# Patient Record
Sex: Male | Born: 1985
Health system: Southern US, Community
[De-identification: ages and names within clinical notes are randomized; demographics above are authoritative.]

## PROBLEM LIST (undated history)

## (undated) DIAGNOSIS — B20 Human immunodeficiency virus [HIV] disease: Secondary | ICD-10-CM

## (undated) DIAGNOSIS — Z21 Asymptomatic human immunodeficiency virus [HIV] infection status: Secondary | ICD-10-CM

## (undated) DIAGNOSIS — D849 Immunodeficiency, unspecified: Secondary | ICD-10-CM

## (undated) DIAGNOSIS — A539 Syphilis, unspecified: Secondary | ICD-10-CM

## (undated) DIAGNOSIS — R51 Headache: Secondary | ICD-10-CM

## (undated) DIAGNOSIS — R519 Headache, unspecified: Secondary | ICD-10-CM

## (undated) HISTORY — DX: Syphilis, unspecified: A53.9

## (undated) HISTORY — DX: Human immunodeficiency virus (HIV) disease: B20

## (undated) HISTORY — DX: Asymptomatic human immunodeficiency virus (hiv) infection status: Z21

---
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
## 2004-01-10 ENCOUNTER — Emergency Department (HOSPITAL_COMMUNITY): Admission: EM | Admit: 2004-01-10 | Discharge: 2004-01-10 | Payer: Self-pay | Admitting: Emergency Medicine

## 2004-12-24 ENCOUNTER — Emergency Department (HOSPITAL_COMMUNITY): Admission: EM | Admit: 2004-12-24 | Discharge: 2004-12-24 | Payer: Self-pay | Admitting: Emergency Medicine

## 2005-07-31 ENCOUNTER — Emergency Department (HOSPITAL_COMMUNITY): Admission: EM | Admit: 2005-07-31 | Discharge: 2005-07-31 | Payer: Self-pay | Admitting: Emergency Medicine

## 2005-10-08 ENCOUNTER — Emergency Department (HOSPITAL_COMMUNITY): Admission: EM | Admit: 2005-10-08 | Discharge: 2005-10-08 | Payer: Self-pay | Admitting: Emergency Medicine

## 2007-08-13 ENCOUNTER — Emergency Department (HOSPITAL_COMMUNITY): Admission: EM | Admit: 2007-08-13 | Discharge: 2007-08-13 | Payer: Self-pay | Admitting: Emergency Medicine

## 2009-08-21 ENCOUNTER — Emergency Department (HOSPITAL_COMMUNITY): Admission: EM | Admit: 2009-08-21 | Discharge: 2009-08-21 | Payer: Self-pay | Admitting: Family Medicine

## 2009-08-24 ENCOUNTER — Emergency Department (HOSPITAL_COMMUNITY): Admission: EM | Admit: 2009-08-24 | Discharge: 2009-08-25 | Payer: Self-pay | Admitting: Emergency Medicine

## 2009-08-29 ENCOUNTER — Emergency Department (HOSPITAL_COMMUNITY): Admission: EM | Admit: 2009-08-29 | Discharge: 2009-08-29 | Payer: Self-pay | Admitting: Family Medicine

## 2010-02-18 ENCOUNTER — Emergency Department (HOSPITAL_COMMUNITY): Admission: EM | Admit: 2010-02-18 | Discharge: 2010-02-18 | Payer: Self-pay | Admitting: Emergency Medicine

## 2011-03-03 LAB — RAPID STREP SCREEN (MED CTR MEBANE ONLY): Streptococcus, Group A Screen (Direct): NEGATIVE

## 2011-03-15 LAB — POCT URINALYSIS DIP (DEVICE)
Bilirubin Urine: NEGATIVE
Glucose, UA: NEGATIVE mg/dL
Hgb urine dipstick: NEGATIVE
Ketones, ur: NEGATIVE mg/dL
Nitrite: NEGATIVE
Protein, ur: 30 mg/dL — AB
Specific Gravity, Urine: 1.025 (ref 1.005–1.030)
Urobilinogen, UA: 0.2 mg/dL (ref 0.0–1.0)
pH: 6 (ref 5.0–8.0)

## 2013-02-14 ENCOUNTER — Encounter (HOSPITAL_COMMUNITY): Payer: Self-pay | Admitting: Emergency Medicine

## 2013-02-14 DIAGNOSIS — K602 Anal fissure, unspecified: Secondary | ICD-10-CM | POA: Insufficient documentation

## 2013-02-14 LAB — CBC
HCT: 43.7 % (ref 39.0–52.0)
MCH: 30.3 pg (ref 26.0–34.0)
MCV: 87.2 fL (ref 78.0–100.0)
Platelets: 313 10*3/uL (ref 150–400)
RBC: 5.01 MIL/uL (ref 4.22–5.81)

## 2013-02-14 NOTE — ED Notes (Signed)
C/o blood in stool x 1 week.  Denies abd pain or any other symptoms.

## 2013-02-15 ENCOUNTER — Emergency Department (HOSPITAL_COMMUNITY)
Admission: EM | Admit: 2013-02-15 | Discharge: 2013-02-15 | Disposition: A | Discharge status: Home / Self Care | Payer: Self-pay | Attending: Emergency Medicine | Admitting: Emergency Medicine

## 2013-02-15 DIAGNOSIS — K602 Anal fissure, unspecified: Secondary | ICD-10-CM

## 2013-02-15 LAB — TYPE AND SCREEN
ABO/RH(D): O POS
Antibody Screen: NEGATIVE

## 2013-02-15 LAB — COMPREHENSIVE METABOLIC PANEL
AST: 21 U/L (ref 0–37)
BUN: 17 mg/dL (ref 6–23)
CO2: 27 mEq/L (ref 19–32)
Calcium: 9.5 mg/dL (ref 8.4–10.5)
Creatinine, Ser: 1.42 mg/dL — ABNORMAL HIGH (ref 0.50–1.35)
GFR calc Af Amer: 77 mL/min — ABNORMAL LOW (ref >90)
GFR calc non Af Amer: 67 mL/min — ABNORMAL LOW (ref >90)
Glucose, Bld: 88 mg/dL (ref 70–99)
Total Bilirubin: 0.6 mg/dL (ref 0.3–1.2)

## 2013-02-15 MED ORDER — DOCUSATE SODIUM 100 MG PO CAPS: 100.0000 mg | ORAL_CAPSULE | Freq: Two times a day (BID) | ORAL | Status: DC

## 2013-02-15 NOTE — ED Provider Notes (Addendum)
History     CSN: [id number]  Arrival date & time 02/14/13  2256   First MD Initiated Contact with Patient 02/15/13 0221      Chief Complaint  Patient presents with  . Rectal Bleeding    (Consider location/radiation/quality/duration/timing/severity/associated sxs/prior treatment) HPI With rectal bleeding onset 1 week ago. He states he sees flecks of blood mixed with brown stool and has pain at the anus upon having a bowel movement. Pain is worse with bowel movements bleeding is slight no bowel pain no change in appetite no fever no other complaint no other associated symptoms no treatment prior to coming here History reviewed. No pertinent past medical history. Medical history negative History reviewed. No pertinent past surgical history.  No family history on file.  History  Substance Use Topics  . Smoking status: Never Smoker   . Smokeless tobacco: Not on file  . Alcohol Use: No      Review of Systems  Constitutional: Negative.   HENT: Negative.   Respiratory: Negative.   Cardiovascular: Negative.   Gastrointestinal: Positive for blood in stool and rectal pain.  Musculoskeletal: Negative.   Skin: Negative.   Neurological: Negative.   Psychiatric/Behavioral: Negative.   All other systems reviewed and are negative.    Allergies  Review of patient's allergies indicates no known allergies.  Home Medications  No current outpatient prescriptions on file.  BP 173/93  Pulse 72  Temp(Src) 98.1 F (36.7 C) (Oral)  Resp 18  SpO2 99%  Physical Exam  Nursing note and vitals reviewed. Constitutional: He appears well-developed and well-nourished.  HENT:  Head: Normocephalic and atraumatic.  Eyes: Conjunctivae are normal. Pupils are equal, round, and reactive to light.  Neck: Neck supple. No tracheal deviation present. No thyromegaly present.  Cardiovascular: Normal rate and regular rhythm.   No murmur heard. Pulmonary/Chest: Effort normal and breath sounds normal.   Abdominal: Soft. Bowel sounds are normal. He exhibits no distension. There is no tenderness.  Genitourinary:  Anal fissure at 12:00. Tender on rectal exam. No stool on examining finger  Musculoskeletal: Normal range of motion. He exhibits no edema and no tenderness.  Neurological: He is alert. Coordination normal.  Skin: Skin is warm and dry. No rash noted.  Psychiatric: He has a normal mood and affect.    ED Course  Procedures (including critical care time)  Labs Reviewed  COMPREHENSIVE METABOLIC PANEL - Abnormal; Notable for the following:    Creatinine, Ser 1.42 (*)    GFR calc non Af Amer 67 (*)    GFR calc Af Amer 77 (*)    All other components within normal limits  CBC  TYPE AND SCREEN  ABO/RH   No results found.   No diagnosis found.  Results for orders placed during the hospital encounter of 02/15/13  CBC      Result Value Range   WBC 8.3  4.0 - 10.5 K/uL   RBC 5.01  4.22 - 5.81 MIL/uL   Hemoglobin 15.2  13.0 - 17.0 g/dL   HCT 56.2  13.0 - 86.5 %   MCV 87.2  78.0 - 100.0 fL   MCH 30.3  26.0 - 34.0 pg   MCHC 34.8  30.0 - 36.0 g/dL   RDW 78.4  69.6 - 29.5 %   Platelets 313  150 - 400 K/uL  COMPREHENSIVE METABOLIC PANEL      Result Value Range   Sodium 140  135 - 145 mEq/L   Potassium 4.4  3.5 - 5.1  mEq/L   Chloride 103  96 - 112 mEq/L   CO2 27  19 - 32 mEq/L   Glucose, Bld 88  70 - 99 mg/dL   BUN 17  6 - 23 mg/dL   Creatinine, Ser 8.11 (*) 0.50 - 1.35 mg/dL   Calcium 9.5  8.4 - 91.4 mg/dL   Total Protein 7.4  6.0 - 8.3 g/dL   Albumin 4.0  3.5 - 5.2 g/dL   AST 21  0 - 37 U/L   ALT RESULTS UNAVAILABLE DUE TO INTERFERING SUBSTANCE  0 - 53 U/L   Alkaline Phosphatase 50  39 - 117 U/L   Total Bilirubin 0.6  0.3 - 1.2 mg/dL   GFR calc non Af Amer 67 (*) >90 mL/min   GFR calc Af Amer 77 (*) >90 mL/min  OCCULT BLOOD, POC DEVICE      Result Value Range   Fecal Occult Bld NEGATIVE  NEGATIVE  TYPE AND SCREEN      Result Value Range   ABO/RH(D) O POS      Antibody Screen NEG     Sample Expiration 02/17/2013     No results found.   MDM  Plan sitz baths prescription Colace. Blood pressure recheck 3 weeks, renal function rechecked by PMD at [facility] gastroenterology referral as needed 1-2 weeks Diagnoses #1 anal fissure #2 elevated blood pressure  #3 renal insufficiency      Doug Sou, MD 02/15/13 7829  Doug Sou, MD 02/15/13 [phone]

## 2013-02-23 ENCOUNTER — Emergency Department (HOSPITAL_COMMUNITY)
Admission: EM | Admit: 2013-02-23 | Discharge: 2013-02-23 | Disposition: A | Discharge status: Home / Self Care | Payer: Self-pay | Attending: Emergency Medicine | Admitting: Emergency Medicine

## 2013-02-23 ENCOUNTER — Encounter (HOSPITAL_COMMUNITY): Payer: Self-pay | Admitting: *Deleted

## 2013-02-23 DIAGNOSIS — A64 Unspecified sexually transmitted disease: Secondary | ICD-10-CM | POA: Insufficient documentation

## 2013-02-23 DIAGNOSIS — K6289 Other specified diseases of anus and rectum: Secondary | ICD-10-CM | POA: Insufficient documentation

## 2013-02-23 DIAGNOSIS — A630 Anogenital (venereal) warts: Secondary | ICD-10-CM | POA: Insufficient documentation

## 2013-02-23 NOTE — ED Notes (Signed)
PT was seen and tx here on 3/9 for anal fissures.  Was told to increase fiber and follow up with [facility].  His s/s improved so much with a high fiber diet that he never went to see the specialist.  Last night pt noticed blood on tp when he wiped.  States no anal sex x 1 month and no constipation.

## 2013-02-23 NOTE — ED Provider Notes (Signed)
History     CSN: [id number]  Arrival date & time 02/23/13  4098   First MD Initiated Contact with Patient 02/23/13 2015      Chief Complaint  Patient presents with  . Rectal Bleeding   HPI  History provided by the patient. Patient is a 27 year old male with no known significant PMH who presents with concerns for episodes of rectal bleeding. Patient also states that he feels like he has noticed very small bumps and growths near his rectum while trying to examine himself in the mirror. Patient was seen 8 days ago for similar symptoms of rectal bleeding also associated with pain. At that time he was diagnosed with a superior anal fissure. Patient was using a high-fiber diet as well as. He was prescribed and stated that symptoms seem to improve. Last night bleeding began he can. Patient states that he has denies any constipation or hard bowel movements. There is only a very small amount of discomfort to the area. Blood is mostly on the tissue paper with no additional active bleeding. Patient is sexually active and is a homosexual who engages in anal sex. He states he is not been sexually active for the past one month. He does not know of any similar growths or lesions on his partner. Patient denies any recent fever, chills or sweats. No night sweats or unintentional weight loss. No swollen lymph nodes. No lesions of the penis her genitals. No penile discharge.     History reviewed. No pertinent past medical history.  History reviewed. No pertinent past surgical history.  No family history on file.  History  Substance Use Topics  . Smoking status: Never Smoker   . Smokeless tobacco: Not on file  . Alcohol Use: No      Review of Systems  Constitutional: Negative for fever, chills, diaphoresis, fatigue and unexpected weight change.  Respiratory: Negative for cough and shortness of breath.   Cardiovascular: Negative for chest pain.  Gastrointestinal: Positive for anal bleeding and  rectal pain.  Skin: Negative for rash.  Neurological: Negative for weakness.  All other systems reviewed and are negative.    Allergies  Review of patient's allergies indicates no known allergies.  Home Medications  No current outpatient prescriptions on file.  BP 156/96  Pulse 71  Temp(Src) 99.3 F (37.4 C) (Oral)  Resp 20  SpO2 99%  Physical Exam  Nursing note and vitals reviewed. Constitutional: He is oriented to person, place, and time. He appears well-developed and well-nourished. No distress.  HENT:  Head: Normocephalic.  Mouth/Throat: Oropharynx is clear and moist.  Neck: Neck supple.  Cardiovascular: Normal rate and regular rhythm.   Pulmonary/Chest: Effort normal and breath sounds normal. No respiratory distress. He has no wheezes.  Abdominal: Soft. There is no tenderness.  Genitourinary:  4 small pedunculated growths around the anus the largest being at the 5:00 position. There is a small tear with dry blood to the base of this lesion. No active bleeding. No significant pain or tenderness on digital exam. Stool appears normal. No internal masses.  Lymphadenopathy:    He has no cervical adenopathy.  Neurological: He is alert and oriented to person, place, and time.  Skin: Skin is warm.  Psychiatric: He has a normal mood and affect. His behavior is normal.    ED Course  Procedures      1. Condylomata acuminata   2. Sexually transmitted disease (STD)       MDM  8:20 PM patient seen and  evaluated. Patient resting comfortably appears in no acute distress. He does not appear ill or toxic.        Angus Seller, PA-C 02/23/13 2049

## 2013-02-23 NOTE — ED Notes (Signed)
Pt alert and mentating appropriately upon d/c; pt given d/c teaching and follow up care instructions; pt verbalizes understanding of d/c teaching and has no further questions upon d/c. Pt ambulatory upon d/c leaving with d/c teaching and follow up care instructions

## 2013-02-23 NOTE — ED Notes (Signed)
Peter Dammen, PA at bedside. 

## 2013-02-23 NOTE — ED Notes (Signed)
Pt states he has seen blood on toilet paper after having BM's since last night; pt denies fever/chills; pt denies n/v/d; pt denies dizziness and lightheadedness; pt denies numbness and tingling; pt alert and mentating appropriately; NAD noted at this time.

## 2013-02-25 NOTE — ED Provider Notes (Signed)
Medical screening examination/treatment/procedure(s) were performed by non-physician practitioner and as supervising physician I was immediately available for consultation/collaboration.  Palak Tercero, MD 02/25/13 1418 

## 2013-05-30 ENCOUNTER — Encounter (HOSPITAL_COMMUNITY): Payer: Self-pay | Admitting: Emergency Medicine

## 2013-05-30 ENCOUNTER — Emergency Department (HOSPITAL_COMMUNITY)
Admission: EM | Admit: 2013-05-30 | Discharge: 2013-05-30 | Disposition: A | Discharge status: Home / Self Care | Payer: Self-pay | Attending: Emergency Medicine | Admitting: Emergency Medicine

## 2013-05-30 DIAGNOSIS — R42 Dizziness and giddiness: Secondary | ICD-10-CM | POA: Insufficient documentation

## 2013-05-30 DIAGNOSIS — G44209 Tension-type headache, unspecified, not intractable: Secondary | ICD-10-CM | POA: Insufficient documentation

## 2013-05-30 HISTORY — DX: Headache: R51

## 2013-05-30 HISTORY — DX: Headache, unspecified: R51.9

## 2013-05-30 MED ORDER — KETOROLAC TROMETHAMINE 60 MG/2ML IM SOLN
60.0000 mg | Freq: Once | INTRAMUSCULAR | Status: AC
  Administered 2013-05-30: 60 mg via INTRAMUSCULAR
  Filled 2013-05-30: qty 2

## 2013-05-30 NOTE — ED Provider Notes (Signed)
History     CSN: [id number]  Arrival date & time 05/30/13  1734   First MD Initiated Contact with Patient 05/30/13 2035      Chief Complaint  Patient presents with  . Headache    (Consider location/radiation/quality/duration/timing/severity/associated sxs/prior treatment) HPI Comments: 27 year old male with a past medical history of headaches presents emergency department complaining of a headache beginning when he woke up from sleep earlier today. Patient states the headache is located above his left eye in the front, described as throbbing, intermittent, rated 7/10. Nothing in specific makes the headaches come or go. Initially he had dizziness associated with headache, however he went to church and the symptoms subsided. States he's been getting headaches on and off since the beginning of June. Normally able to eat something in the headache will go away. He tried eating something today without relief. Also took Advil without relief. Admits to decreased fluid intake today as he has not had a chance to drink as much water as normal. Denies visual disturbance, eye pain, nausea, vomiting or spots in the field of vision. No fever or chills. No history of migraines.  Patient is a 27 y.o. male presenting with headaches. The history is provided by the patient.  Headache Associated symptoms: dizziness (subsided)   Associated symptoms: no fever, no myalgias, no nausea, no neck pain, no neck stiffness, no numbness, no photophobia and no vomiting     Past Medical History  Diagnosis Date  . Headache     History reviewed. No pertinent past surgical history.  No family history on file.  History  Substance Use Topics  . Smoking status: Never Smoker   . Smokeless tobacco: Not on file  . Alcohol Use: No      Review of Systems  Constitutional: Negative for fever and chills.  HENT: Negative for neck pain and neck stiffness.   Eyes: Negative for photophobia and visual disturbance.    Gastrointestinal: Negative for nausea and vomiting.  Musculoskeletal: Negative for myalgias and arthralgias.  Skin: Negative for rash.  Neurological: Positive for dizziness (subsided) and headaches. Negative for weakness and numbness.  All other systems reviewed and are negative.    Allergies  Review of patient's allergies indicates no known allergies.  Home Medications  No current outpatient prescriptions on file.  BP 133/79  Pulse 55  Temp(Src) 98.6 F (37 C) (Oral)  Resp 16  SpO2 100%  Physical Exam  Nursing note and vitals reviewed. Constitutional: He is oriented to person, place, and time. He appears well-developed and well-nourished. No distress.  HENT:  Head: Normocephalic and atraumatic.    Mouth/Throat: Oropharynx is clear and moist.  Eyes: Conjunctivae and EOM are normal. Pupils are equal, round, and reactive to light.  Neck: Normal range of motion. Neck supple.  Cardiovascular: Normal rate, regular rhythm and normal heart sounds.   Pulmonary/Chest: Effort normal and breath sounds normal.  Musculoskeletal: Normal range of motion. He exhibits no edema.  Neurological: He is alert and oriented to person, place, and time. He has normal strength. No cranial nerve deficit or sensory deficit. Coordination and gait normal.  Skin: Skin is warm and dry. He is not diaphoretic.  Psychiatric: He has a normal mood and affect. His behavior is normal.    ED Course  Procedures (including critical care time)  Labs Reviewed - No data to display No results found.   1. Headache   2. Tension headache       MDM  Left frontal headache- no  red flags concerning patient's headache. No focal neurologic deficits, neuro exam unremarkable. No neck pain or stiffness. No visual disturbances, nausea or vomiting. He is in no apparent distress. Upon entering the room, he was watching a video on his cell phone. Will give Toradol and reassess. 9:41 PM headeache decreased to 3/10 with  toradol. He is in NAD. Stable for discharge. Advised increased fluid intake. Return precautions discussed. Patient states understanding of plan and is agreeable.       Trevor Mace, PA-C 05/30/13 2141

## 2013-05-30 NOTE — ED Notes (Signed)
Pt reports headache upon awaking today. Headache is located across forehead. Pt reports dizziness with headache. Pt denies change in vision.

## 2013-05-30 NOTE — ED Provider Notes (Signed)
Medical screening examination/treatment/procedure(s) were performed by non-physician practitioner and as supervising physician I was immediately available for consultation/collaboration.  Shelda Jakes, MD 05/30/13 2207

## 2013-12-02 ENCOUNTER — Emergency Department (HOSPITAL_COMMUNITY)
Admission: EM | Admit: 2013-12-02 | Discharge: 2013-12-02 | Disposition: A | Discharge status: Home / Self Care | Payer: Self-pay | Attending: Emergency Medicine | Admitting: Emergency Medicine

## 2013-12-02 ENCOUNTER — Encounter (HOSPITAL_COMMUNITY): Payer: Self-pay | Admitting: Emergency Medicine

## 2013-12-02 DIAGNOSIS — K602 Anal fissure, unspecified: Secondary | ICD-10-CM | POA: Insufficient documentation

## 2013-12-02 MED ORDER — DOCUSATE SODIUM 100 MG PO CAPS: 100.0000 mg | ORAL_CAPSULE | Freq: Two times a day (BID) | ORAL | Status: DC

## 2013-12-02 NOTE — ED Notes (Signed)
Pt states he had a bowel movement today, was straining and has been constipated, pt states he had specks of blood on his bowel movement. Pt states he has been increasing his fiber because he was seen by pcp in Aug for same and told that he may not be getting enough fiber in his diet. Pt denies any pain except with a bowel movement.

## 2013-12-02 NOTE — ED Notes (Signed)
Discharge and follow up instructions reviewed with pt. Pt verbalized understanding.  

## 2013-12-02 NOTE — ED Provider Notes (Signed)
CSN: [id number]     Arrival date & time 12/02/13  1913 History   First MD Initiated Contact with Patient 12/02/13 2014     Chief Complaint  Patient presents with  . Rectal Bleeding   (Consider location/radiation/quality/duration/timing/severity/associated sxs/prior Treatment) HPI Comments: Patient is a 27 year old male with history of anal fissure who presents today for rectal bleeding. His rectal bleeding has been mild for the past 3 weeks. He only has pain and BRBPR when has a bowel movement. The pain is sharp in nature. He has been adding Benefiber to his food, which initially seemed to help. He does still strain when he has a bowel movement and struggles with constipation. He denies any other symptoms including shortness of breath, fatigue, pallor, nausea, vomiting, abdominal pain.   Patient is a 27 y.o. male presenting with hematochezia. The history is provided by the patient. No language interpreter was used.  Rectal Bleeding Associated symptoms: no abdominal pain, no fever and no vomiting     Past Medical History  Diagnosis Date  . Headache    History reviewed. No pertinent past surgical history. History reviewed. No pertinent family history. History  Substance Use Topics  . Smoking status: Never Smoker   . Smokeless tobacco: Not on file  . Alcohol Use: No    Review of Systems  Constitutional: Negative for fever, chills and fatigue.  Respiratory: Negative for shortness of breath.   Cardiovascular: Negative for chest pain.  Gastrointestinal: Positive for constipation, hematochezia, anal bleeding and rectal pain. Negative for nausea, vomiting, abdominal pain, diarrhea and blood in stool.  All other systems reviewed and are negative.    Allergies  Review of patient's allergies indicates no known allergies.  Home Medications  No current outpatient prescriptions on file. BP 148/76  Pulse 68  Temp(Src) 97.9 F (36.6 C) (Oral)  Resp 18  Wt 218 lb 7 oz (99.083 kg)   SpO2 100% Physical Exam  Nursing note and vitals reviewed. Constitutional: He is oriented to person, place, and time. He appears well-developed and well-nourished. No distress.  HENT:  Head: Normocephalic and atraumatic.  Right Ear: External ear normal.  Left Ear: External ear normal.  Nose: Nose normal.  Eyes: Conjunctivae are normal.  Neck: Normal range of motion. No tracheal deviation present.  Cardiovascular: Normal rate, regular rhythm and normal heart sounds.   Pulmonary/Chest: Effort normal and breath sounds normal. No stridor.  Abdominal: Soft. He exhibits no distension. There is no tenderness.  Genitourinary: Rectal exam shows tenderness. Rectal exam shows anal tone normal.  Anterior anal fissure which appears chronic. TTP. No stool in rectal vault.   Musculoskeletal: Normal range of motion.  Neurological: He is alert and oriented to person, place, and time.  Skin: Skin is warm and dry. He is not diaphoretic.  Psychiatric: He has a normal mood and affect. His behavior is normal.    ED Course  Procedures (including critical care time) Labs Review Labs Reviewed - No data to display Imaging Review No results found.  EKG Interpretation   None       MDM   1. Anal fissure    Patient presents to Ed with anal fissure. No melena. No concern for GI bleed or severe blood loss. No sx of anemia. I do not feel as though any testing is required at this time. Pt already taking Benefiber. Rx for colace. Discussed use of Sitz bath for sx control. He will follow up with his PCP. Return instructions given. Vital  signs stable for discharge. Patient / Family / Caregiver informed of clinical course, understand medical decision-making process, and agree with plan.   Mora Bellman, PA-C 12/02/13 2110

## 2013-12-02 NOTE — ED Notes (Addendum)
Rectal bleeding x 3 weeks; mild. Went to pcp and told that pt. Wasn't eating enough fruit and fiber. Not sure if he has hemorrhoids.

## 2013-12-02 NOTE — ED Notes (Signed)
Pt states he has not been drinking as much water as he should.

## 2013-12-06 NOTE — ED Provider Notes (Signed)
Medical screening examination/treatment/procedure(s) were performed by non-physician practitioner and as supervising physician I was immediately available for consultation/collaboration.    Armella Stogner R Whittany Parish, MD 12/06/13 1607 

## 2014-01-27 ENCOUNTER — Encounter (HOSPITAL_COMMUNITY): Payer: Self-pay | Admitting: Emergency Medicine

## 2014-01-27 ENCOUNTER — Emergency Department (HOSPITAL_COMMUNITY)
Admission: EM | Admit: 2014-01-27 | Discharge: 2014-01-27 | Disposition: A | Discharge status: Home / Self Care | Payer: BC Managed Care – PPO | Attending: Emergency Medicine | Admitting: Emergency Medicine

## 2014-01-27 DIAGNOSIS — R05 Cough: Secondary | ICD-10-CM | POA: Insufficient documentation

## 2014-01-27 DIAGNOSIS — R059 Cough, unspecified: Secondary | ICD-10-CM | POA: Insufficient documentation

## 2014-01-27 DIAGNOSIS — J029 Acute pharyngitis, unspecified: Secondary | ICD-10-CM | POA: Insufficient documentation

## 2014-01-27 LAB — RAPID STREP SCREEN (MED CTR MEBANE ONLY): STREPTOCOCCUS, GROUP A SCREEN (DIRECT): NEGATIVE

## 2014-01-27 MED ORDER — HYDROCODONE-ACETAMINOPHEN 7.5-325 MG/15ML PO SOLN: 10.0000 mL | Freq: Four times a day (QID) | ORAL | Status: DC | PRN

## 2014-01-27 NOTE — ED Notes (Signed)
Pt dc to home. Pt sts understanding to dc instructions. Pt ambulatory to exit without difficulty.

## 2014-01-27 NOTE — Discharge Instructions (Signed)
Sore Throat A sore throat is pain, burning, irritation, or scratchiness of the throat. There is often pain or tenderness when swallowing or talking. A sore throat may be accompanied by other symptoms, such as coughing, sneezing, fever, and swollen neck glands. A sore throat is often the first sign of another sickness, such as a cold, flu, strep throat, or mononucleosis (commonly known as mono). Most sore throats go away without medical treatment. CAUSES  The most common causes of a sore throat include:  A viral infection, such as a cold, flu, or mono.  A bacterial infection, such as strep throat, tonsillitis, or whooping cough.  Seasonal allergies.  Dryness in the air.  Irritants, such as smoke or pollution.  Gastroesophageal reflux disease (GERD). HOME CARE INSTRUCTIONS   Only take over-the-counter medicines as directed by your caregiver.  Drink enough fluids to keep your urine clear or pale yellow.  Rest as needed.  Try using throat sprays, lozenges, or sucking on hard candy to ease any pain (if older than 4 years or as directed).  Sip warm liquids, such as broth, herbal tea, or warm water with honey to relieve pain temporarily. You may also eat or drink cold or frozen liquids such as frozen ice pops.  Gargle with salt water (mix 1 tsp salt with 8 oz of water).  Do not smoke and avoid secondhand smoke.  Put a cool-mist humidifier in your bedroom at night to moisten the air. You can also turn on a hot shower and sit in the bathroom with the door closed for 5 10 minutes. SEEK IMMEDIATE MEDICAL CARE IF:  You have difficulty breathing.  You are unable to swallow fluids, soft foods, or your saliva.  You have increased swelling in the throat.  Your sore throat does not get better in 7 days.  You have nausea and vomiting.  You have a fever or persistent symptoms for more than 2 3 days.  You have a fever and your symptoms suddenly get worse. MAKE SURE YOU:   Understand  these instructions.  Will watch your condition.  Will get help right away if you are not doing well or get worse. Document Released: 01/02/2005 Document Revised: 11/11/2012 Document Reviewed: 08/02/2012 [employer] Patient Information 2014 [employer].  

## 2014-01-27 NOTE — ED Notes (Signed)
Reports sore throat x 3 weeks, denies fever/chills. Airway intact.

## 2014-01-27 NOTE — ED Provider Notes (Signed)
CSN: [id number]     Arrival date & time 01/27/14  1423 History  This chart was scribed for Domenic Moras, PA working with Tanna Furry, MD by Roxan Diesel, ED Scribe. This patient was seen in room TR04C/TR04C and the patient's care was started at 3:37 PM.   Chief Complaint  Patient presents with  . Sore Throat    The history is provided by the patient. No language interpreter was used.    HPI Comments: Luis Vincent is a 28 y.o. male who presents to the Emergency Department complaining of a sore throat that has been occurring off-and-on for nearly one month.  Pt states that he initially thought he was developing a cold when he developed a sore threat but his sore throat then resolved spontaneously within several days.  His sore throat has continued to recur and then resolve on its own.  Pain is worsened by drinking and swallowing.  He has attempted to treat pain with Nyquil and cough drops, with some relief.  Today while brushing his teeth he also noticed "some white stuff" in the left side of his throat.  Pt also notes an associated cough which does not worsen his sore throat.  He denies rhinorrhea, fever, chills, or abdominal pain.   Past Medical History  Diagnosis Date  . Headache     History reviewed. No pertinent past surgical history.  History reviewed. No pertinent family history.   History  Substance Use Topics  . Smoking status: Never Smoker   . Smokeless tobacco: Not on file  . Alcohol Use: No     Review of Systems  Constitutional: Negative for fever and chills.  HENT: Positive for sore throat. Negative for rhinorrhea.   Respiratory: Positive for cough.   Gastrointestinal: Negative for abdominal pain.      Allergies  Review of patient's allergies indicates no known allergies.  Home Medications   No current outpatient prescriptions on file. BP 150/81  Pulse 74  Temp(Src) 98.9 F (37.2 C) (Oral)  Resp 20  Ht 5\' 11"  (1.803 m)  Wt 217 lb (98.431 kg)   BMI 30.28 kg/m2  SpO2 100%  Physical Exam  Nursing note and vitals reviewed. Constitutional: He is oriented to person, place, and time. He appears well-developed and well-nourished. No distress.  HENT:  Head: Normocephalic and atraumatic.  Left Ear: Tympanic membrane normal.  Nose: No rhinorrhea.  Mouth/Throat: Uvula is midline. Posterior oropharyngeal erythema present. No oropharyngeal exudate or posterior oropharyngeal edema.  No tonsillar enlargement or exudate.  No evidence of deep tissue infection. Right cerumen impaction, unable to visualize TM.  Eyes: EOM are normal.  Neck: Neck supple. No tracheal deviation present.  Cardiovascular: Normal rate.   Pulmonary/Chest: Effort normal. No respiratory distress.  Musculoskeletal: Normal range of motion.  Lymphadenopathy:    He has no cervical adenopathy.  Neurological: He is alert and oriented to person, place, and time.  Skin: Skin is warm and dry.  Psychiatric: He has a normal mood and affect. His behavior is normal.    ED Course  Procedures (including critical care time)  DIAGNOSTIC STUDIES: Oxygen Saturation is 100% on room air, normal by my interpretation.    COORDINATION OF CARE: 3:42 PM-Discussed treatment plan which includes strep screen with pt at bedside and pt agreed to plan.   4:25 PM Strep test neg.  Pt has no evidence of deep tissue infection.  Tolerates own secretion, non toxic. Symptomatic care discussed.     Labs Review Labs Reviewed  RAPID STREP SCREEN    Imaging Review No results found.  EKG Interpretation   None       MDM   Final diagnoses:  Sore throat    BP 150/81  Pulse 74  Temp(Src) 98.9 F (37.2 C) (Oral)  Resp 20  Ht 5\' 11"  (1.803 m)  Wt 217 lb (98.431 kg)  BMI 30.28 kg/m2  SpO2 100%   I personally performed the services described in this documentation, which was scribed in my presence. The recorded information has been reviewed and is accurate.     Domenic Moras,  PA-C 01/27/14 1626

## 2014-01-28 ENCOUNTER — Emergency Department (HOSPITAL_COMMUNITY)
Admission: EM | Admit: 2014-01-28 | Discharge: 2014-01-29 | Disposition: A | Discharge status: Home / Self Care | Payer: BC Managed Care – PPO | Attending: Emergency Medicine | Admitting: Emergency Medicine

## 2014-01-28 ENCOUNTER — Encounter (HOSPITAL_COMMUNITY): Payer: Self-pay | Admitting: Emergency Medicine

## 2014-01-28 DIAGNOSIS — J039 Acute tonsillitis, unspecified: Secondary | ICD-10-CM | POA: Insufficient documentation

## 2014-01-28 NOTE — ED Notes (Addendum)
Pt reports that he as had a sore throat for the past 2 weeks, reports that it is now difficult to swallow and has been feeling hot.  Pt reports he has taken Ibuprofen, Nyquil, using a salt water gargle, all without relief. Pt was seen at [facility] yesterday for the same symptoms but states that he was not given anything for the pain

## 2014-01-29 LAB — CULTURE, GROUP A STREP

## 2014-01-29 MED ORDER — MAGIC MOUTHWASH W/LIDOCAINE: 5.0000 mL | Freq: Three times a day (TID) | ORAL | Status: DC

## 2014-01-29 MED ORDER — AMOXICILLIN 500 MG PO CAPS: 500.0000 mg | ORAL_CAPSULE | Freq: Three times a day (TID) | ORAL | Status: DC

## 2014-01-29 MED ORDER — LIDOCAINE VISCOUS 2 % MT SOLN
15.0000 mL | Freq: Once | OROMUCOSAL | Status: AC
Start: 2014-01-29 — End: 2014-01-29
  Administered 2014-01-29: 15 mL via OROMUCOSAL
  Filled 2014-01-29: qty 15

## 2014-01-29 NOTE — ED Notes (Signed)
Pt ambulating independently w/ steady gait on d/c in no acute distress, A&Ox4. Rx given x2  

## 2014-01-29 NOTE — ED Provider Notes (Signed)
CSN: [id number]     Arrival date & time 01/28/14  2333 History   First MD Initiated Contact with Patient 01/29/14 0134     Chief Complaint  Patient presents with  . Sore Throat   HPI  History provided by the patient. Patient is a 28 year old male with no significant PMH presents with complaints of continued and worsening sore throat. Patient reports having sore throat for the past 2 weeks. Over this past week symptoms have been associated with dry cough and occasional rhinorrhea. He was seen and evaluated in the emergency room 4 days ago with a negative strep test. He states he's been using ibuprofen, NyQuil, saltwater gargle and tea with honey has not had any improvement of his sore throat. He states pain has become much more severe making it difficult for you to drink anything. Denies any associated vomiting or diarrhea. Does report some subjective fevers. Patient does work for the [employer] system so he is around young children. Denies any specific sick contacts. No recent travel. No other aggravating or alleviating factors. No other associated symptoms.   Past Medical History  Diagnosis Date  . Headache    History reviewed. No pertinent past surgical history. History reviewed. No pertinent family history. History  Substance Use Topics  . Smoking status: Never Smoker   . Smokeless tobacco: Never Used  . Alcohol Use: No    Review of Systems  Constitutional: Positive for fever, chills, appetite change and fatigue.  HENT: Positive for rhinorrhea and sore throat. Negative for congestion.   Respiratory: Positive for cough.   Gastrointestinal: Negative for nausea, vomiting and diarrhea.  Musculoskeletal: Negative for neck pain and neck stiffness.  Skin: Negative for rash.  All other systems reviewed and are negative.      Allergies  Review of patient's allergies indicates no known allergies.  Home Medications   Current Outpatient Rx  Name  Route  Sig  Dispense   Refill  . ibuprofen (ADVIL,MOTRIN) 200 MG tablet   Oral   Take 400 mg by mouth every 8 (eight) hours as needed (for pain.).          BP 134/71  Pulse 85  Temp(Src) 99.8 F (37.7 C) (Oral)  Resp 16  SpO2 97% Physical Exam  Nursing note and vitals reviewed. Constitutional: He is oriented to person, place, and time. He appears well-developed and well-nourished. No distress.  HENT:  Head: Normocephalic and atraumatic.  Diffuse erythema of the pharynx and tonsils. Mild tonsillar enlargement. There is slight exudate around the right tonsil area. Uvula midline. No trismus.  Neck: Normal range of motion. Neck supple.  Cardiovascular: Normal rate and regular rhythm.   Pulmonary/Chest: Effort normal and breath sounds normal. No respiratory distress. He has no wheezes.  Abdominal: Soft.  Lymphadenopathy:    He has cervical adenopathy.  Neurological: He is alert and oriented to person, place, and time.  Skin: Skin is warm. No rash noted.  Psychiatric: He has a normal mood and affect. His behavior is normal.    ED Course  Procedures   DIAGNOSTIC STUDIES: Oxygen Saturation is97% on room air.  COORDINATION OF CARE:  Nursing notes reviewed. Vital signs reviewed. Initial pt interview and examination performed.   2:53 AM-patient seen and evaluated. The patient appears well nontoxic appearing. Patient did have a negative strep test several days ago. Exam is more concerning for infection today. Symptoms have been present for the past 2 weeks. At this time discussed with patient plans for  a prescription of amoxicillin and continue symptomatic treatment for pain. Pt agrees with plan. Strict return precautions given.   Treatment plan initiated: Medications  lidocaine (XYLOCAINE) 2 % viscous mouth solution 15 mL (not administered)      MDM   Final diagnoses:  Tonsillitis        Martie Lee, PA-C 01/29/14 [phone]

## 2014-01-29 NOTE — ED Notes (Signed)
Pt reports sore throat x2 weeks, taken OTC medications w/o relief - pt seen at [facility] for same complaint and here tonight d/t no rx for pain medications.

## 2014-01-29 NOTE — Discharge Instructions (Signed)
Please take antibiotics as prescribed and followup with a primary care provider for continued evaluation and treatment.    Tonsillitis Tonsillitis is an infection of the throat that causes the tonsils to become red, tender, and swollen. Tonsils are collections of lymphoid tissue at the back of the throat. Each tonsil has crevices (crypts). Tonsils help fight nose and throat infections and keep infection from spreading to other parts of the body for the first 18 months of life.  CAUSES Sudden (acute) tonsillitis is usually caused by infection with streptococcal bacteria. Long-lasting (chronic) tonsillitis occurs when the crypts of the tonsils become filled with pieces of food and bacteria, which makes it easy for the tonsils to become repeatedly infected. SYMPTOMS  Symptoms of tonsillitis include:  A sore throat, with possible difficulty swallowing.  White patches on the tonsils.  Fever.  Tiredness.  New episodes of snoring during sleep, when you did not snore before.  Small, foul-smelling, yellowish-white pieces of material (tonsilloliths) that you occasionally cough up or spit out. The tonsilloliths can also cause you to have bad breath. DIAGNOSIS Tonsillitis can be diagnosed through a physical exam. Diagnosis can be confirmed with the results of lab tests, including a throat culture. TREATMENT  The goals of tonsillitis treatment include the reduction of the severity and duration of symptoms and prevention of associated conditions. Symptoms of tonsillitis can be improved with the use of steroids to reduce the swelling. Tonsillitis caused by bacteria can be treated with antibiotics. Usually, treatment with antibiotics is started before the cause of the tonsillitis is known. However, if it is determined that the cause is not bacterial, antibiotics will not treat the tonsillitis. If attacks of tonsillitis are severe and frequent, your caregiver may recommend surgery to remove the tonsils  (tonsillectomy). HOME CARE INSTRUCTIONS   Rest as much as possible and get plenty of sleep.  Drink plenty of fluids. While the throat is very sore, eat soft foods or liquids, such as sherbet, soups, or instant breakfast drinks.  Eat frozen ice pops.  Gargle with a warm or cold liquid to help soothe the throat. Mix 1/4 teaspoon of salt and 1/4 teaspoon of baking soda in in 8 oz of water. SEEK MEDICAL CARE IF:   Large, tender lumps develop in your neck.  A rash develops.  A green, yellow-brown, or bloody substance is coughed up.  You are unable to swallow liquids or food for 24 hours.  You notice that only one of the tonsils is swollen. SEEK IMMEDIATE MEDICAL CARE IF:   You develop any new symptoms such as vomiting, severe headache, stiff neck, chest pain, or trouble breathing or swallowing.  You have severe throat pain along with drooling or voice changes.  You have severe pain, unrelieved with recommended medications.  You are unable to fully open the mouth.  You develop redness, swelling, or severe pain anywhere in the neck.  You have a fever. MAKE SURE YOU:   Understand these instructions.  Will watch your condition.  Will get help right away if you are not doing well or get worse. Document Released: 09/04/2005 Document Revised: 07/28/2013 Document Reviewed: 05/14/2013 [facility] Patient Information 2014 [place], [place].

## 2014-01-30 NOTE — ED Provider Notes (Signed)
Medical screening examination/treatment/procedure(s) were performed by non-physician practitioner and as supervising physician I was immediately available for consultation/collaboration.   Teressa Lower, MD 01/30/14 [phone]

## 2014-01-31 NOTE — ED Provider Notes (Signed)
Medical screening examination/treatment/procedure(s) were performed by non-physician practitioner and as supervising physician I was immediately available for consultation/collaboration.  EKG Interpretation   None         Tanna Furry, MD 01/31/14 [phone]

## 2014-02-17 ENCOUNTER — Other Ambulatory Visit (HOSPITAL_COMMUNITY)
Admission: RE | Admit: 2014-02-17 | Discharge: 2014-02-17 | Disposition: A | Discharge status: Home / Self Care | Payer: BC Managed Care – PPO | Source: Ambulatory Visit | Attending: Internal Medicine | Admitting: Internal Medicine

## 2014-02-17 ENCOUNTER — Ambulatory Visit: Payer: BC Managed Care – PPO

## 2014-02-17 ENCOUNTER — Encounter: Payer: Self-pay | Admitting: Internal Medicine

## 2014-02-17 ENCOUNTER — Ambulatory Visit (INDEPENDENT_AMBULATORY_CARE_PROVIDER_SITE_OTHER): Payer: BC Managed Care – PPO | Admitting: Internal Medicine

## 2014-02-17 VITALS — BP 151/89 | HR 71 | Temp 98.2°F | Wt 212.0 lb

## 2014-02-17 DIAGNOSIS — Z0000 Encounter for general adult medical examination without abnormal findings: Secondary | ICD-10-CM

## 2014-02-17 DIAGNOSIS — B20 Human immunodeficiency virus [HIV] disease: Secondary | ICD-10-CM | POA: Insufficient documentation

## 2014-02-17 DIAGNOSIS — Z113 Encounter for screening for infections with a predominantly sexual mode of transmission: Secondary | ICD-10-CM | POA: Insufficient documentation

## 2014-02-17 DIAGNOSIS — Z23 Encounter for immunization: Secondary | ICD-10-CM

## 2014-02-17 DIAGNOSIS — A5149 Other secondary syphilitic conditions: Secondary | ICD-10-CM

## 2014-02-17 LAB — CBC WITH DIFFERENTIAL/PLATELET
BASOS ABS: 0.1 10*3/uL (ref 0.0–0.1)
Basophils Relative: 2 % — ABNORMAL HIGH (ref 0–1)
EOS ABS: 0.1 10*3/uL (ref 0.0–0.7)
EOS PCT: 2 % (ref 0–5)
HCT: 43.8 % (ref 39.0–52.0)
Hemoglobin: 14.9 g/dL (ref 13.0–17.0)
LYMPHS ABS: 2.2 10*3/uL (ref 0.7–4.0)
Lymphocytes Relative: 45 % (ref 12–46)
MCH: 29.7 pg (ref 26.0–34.0)
MCHC: 34 g/dL (ref 30.0–36.0)
MCV: 87.3 fL (ref 78.0–100.0)
Monocytes Absolute: 0.4 10*3/uL (ref 0.1–1.0)
Monocytes Relative: 8 % (ref 3–12)
Neutro Abs: 2.1 10*3/uL (ref 1.7–7.7)
Neutrophils Relative %: 43 % (ref 43–77)
PLATELETS: 305 10*3/uL (ref 150–400)
RBC: 5.02 MIL/uL (ref 4.22–5.81)
RDW: 14.2 % (ref 11.5–15.5)
WBC: 4.9 10*3/uL (ref 4.0–10.5)

## 2014-02-17 MED ORDER — PENICILLIN G BENZATHINE 1200000 UNIT/2ML IM SUSP
1.2000 10*6.[IU] | Freq: Once | INTRAMUSCULAR | Status: AC
  Administered 2014-02-17: 1.2 10*6.[IU] via INTRAMUSCULAR

## 2014-02-17 MED ORDER — ELVITEG-COBIC-EMTRICIT-TENOFDF 150-150-200-300 MG PO TABS: 1.0000 | ORAL_TABLET | Freq: Every day | ORAL | Status: DC

## 2014-02-17 NOTE — Progress Notes (Signed)
Subjective:    Patient ID: Luis Vincent, male    DOB: 1985-12-29, 29 y.o.   MRN: [id number]  HPI 28yo M with HIV, with recent hiv diagnosis of 01/28/14, last had sexual encounter with male in late January. Last tested negative for HIV march 2014. Has had 3 partners in the course of a year. RF for HIV is MSM. Did go for his annual exam due to hair loss and tested + HIV but also found out to have syphilis. He does report noticing rash to shoulder blades. According to discussion with [facility] and Dr. Tommy Medal it appears that his lab work is c/w acute hiv( need to obtain for our records). The patient also noted to have submandibular LAD diagnosed with tonsillitis 2 wks ago, given amoxicillin. Otherwise in good state of health. Never hospitalized.  Has not disclosed to family, but has a friend with HIV who he confides in.   Med: takes herbal life All: NKMA Active Ambulatory Problems    Diagnosis Date Noted  . No Active Ambulatory Problems   Resolved Ambulatory Problems    Diagnosis Date Noted  . No Resolved Ambulatory Problems   Past Medical History  Diagnosis Date  . Headache    History  Substance Use Topics  . Smoking status: Never Smoker   . Smokeless tobacco: Never Used  . Alcohol Use: No  - has been a school busdriver for 9 yr.  family history is not on file.   Review of Systems  Constitutional: Negative for fever, chills, diaphoresis, activity change, appetite change, fatigue and unexpected weight change.  HENT: Negative for congestion, sore throat, rhinorrhea, sneezing, trouble swallowing and sinus pressure.  Eyes: Negative for photophobia and visual disturbance.  Respiratory: Negative for cough, chest tightness, shortness of breath, wheezing and stridor.  Cardiovascular: Negative for chest pain, palpitations and leg swelling.  Gastrointestinal: Negative for nausea, vomiting, abdominal pain, diarrhea, constipation, blood in stool, abdominal distention and anal bleeding.    Genitourinary: Negative for dysuria, hematuria, flank pain and difficulty urinating.  Musculoskeletal: Negative for myalgias, back pain, joint swelling, arthralgias and gait problem.  Skin: Negative for color change, pallor, rash and wound.  Neurological: Negative for dizziness, tremors, weakness and light-headedness.  Hematological: Negative for adenopathy. Does not bruise/bleed easily.  Psychiatric/Behavioral: Negative for behavioral problems, confusion, sleep disturbance, dysphoric mood, decreased concentration and agitation.       Objective:   Physical Exam BP 151/89  Pulse 71  Temp(Src) 98.2 F (36.8 C) (Oral)  Wt 212 lb (96.163 kg)  Constitutional: He is oriented to person, place, and time. He appears well-developed and well-nourished. No distress.  HENT: + submandibular LN Mouth/Throat: Oropharynx is clear and moist. No oropharyngeal exudate.  Cardiovascular: Normal rate, regular rhythm and normal heart sounds. Exam reveals no gallop and no friction rub.  No murmur heard.  Pulmonary/Chest: Effort normal and breath sounds normal. No respiratory distress. He has no wheezes.  Abdominal: Soft. Bowel sounds are normal. He exhibits no distension. There is no tenderness.  Lymphadenopathy:  He has no cervical adenopathy.  Neurological: He is alert and oriented to person, place, and time.  Skin: Skin is warm and dry. No rash noted. No erythema.  Psychiatric: He has a normal mood and affect. His behavior is normal.      Assessment & Plan:  hiv = will get baseline labs, start stribild for acute hiv. To deliver and start on 3/13  Tonsillitis= likely lymphadenopathy due to hiv disease, will not need  antibiotics  Syphilis = will get #2 inj of penicillin, and appt next week for the 3rd inj.  Health maintenance = will give flu and pneumovax  rtc in 6wk

## 2014-02-18 LAB — T.PALLIDUM AB, TOTAL: T PALLIDUM ANTIBODIES (TP-PA): 7.49 {s_co_ratio} — AB (ref <0.90)

## 2014-02-18 LAB — HEPATITIS A ANTIBODY, TOTAL: Hep A Total Ab: BORDERLINE — AB

## 2014-02-18 LAB — URINALYSIS, ROUTINE W REFLEX MICROSCOPIC
GLUCOSE, UA: NEGATIVE mg/dL
HGB URINE DIPSTICK: NEGATIVE
KETONES UR: NEGATIVE mg/dL
Leukocytes, UA: NEGATIVE
Nitrite: NEGATIVE
PROTEIN: NEGATIVE mg/dL
Specific Gravity, Urine: 1.029 (ref 1.005–1.030)
Urobilinogen, UA: 1 mg/dL (ref 0.0–1.0)
pH: 6 (ref 5.0–8.0)

## 2014-02-18 LAB — COMPLETE METABOLIC PANEL WITH GFR
ALT: 28 U/L (ref 0–53)
AST: 22 U/L (ref 0–37)
Albumin: 4.2 g/dL (ref 3.5–5.2)
Alkaline Phosphatase: 50 U/L (ref 39–117)
BILIRUBIN TOTAL: 1 mg/dL (ref 0.2–1.2)
BUN: 7 mg/dL (ref 6–23)
CO2: 27 meq/L (ref 19–32)
CREATININE: 1.12 mg/dL (ref 0.50–1.35)
Calcium: 9 mg/dL (ref 8.4–10.5)
Chloride: 102 mEq/L (ref 96–112)
GFR, Est Non African American: 89 mL/min
Glucose, Bld: 79 mg/dL (ref 70–99)
Potassium: 4.5 mEq/L (ref 3.5–5.3)
Sodium: 138 mEq/L (ref 135–145)
Total Protein: 7 g/dL (ref 6.0–8.3)

## 2014-02-18 LAB — URINE CYTOLOGY ANCILLARY ONLY
Chlamydia: NEGATIVE
Neisseria Gonorrhea: NEGATIVE

## 2014-02-18 LAB — HEPATITIS C ANTIBODY: HCV AB: NEGATIVE

## 2014-02-18 LAB — LIPID PANEL
CHOLESTEROL: 203 mg/dL — AB (ref 0–200)
HDL: 34 mg/dL — ABNORMAL LOW (ref >39)
LDL CALC: 150 mg/dL — AB (ref 0–99)
Total CHOL/HDL Ratio: 6 Ratio
Triglycerides: 95 mg/dL (ref <150)
VLDL: 19 mg/dL (ref 0–40)

## 2014-02-18 LAB — HEPATITIS B SURFACE ANTIGEN: Hepatitis B Surface Ag: NEGATIVE

## 2014-02-18 LAB — HEPATITIS B CORE ANTIBODY, TOTAL: HEP B C TOTAL AB: NONREACTIVE

## 2014-02-18 LAB — T-HELPER CELL (CD4) - (RCID CLINIC ONLY)
CD4 % Helper T Cell: 27 % — ABNORMAL LOW (ref 33–55)
CD4 T Cell Abs: 590 /uL (ref 400–2700)

## 2014-02-18 LAB — RPR: RPR: REACTIVE — AB

## 2014-02-18 LAB — HEPATITIS B SURFACE ANTIBODY,QUALITATIVE: Hep B S Ab: POSITIVE — AB

## 2014-02-18 LAB — RPR TITER: RPR Titer: 1:128 {titer} — AB

## 2014-02-20 LAB — HIV-1 RNA ULTRAQUANT REFLEX TO GENTYP+
HIV 1 RNA QUANT: 594992 {copies}/mL — AB (ref <20)
HIV-1 RNA QUANT, LOG: 5.77 {Log} — AB (ref <1.30)

## 2014-02-24 ENCOUNTER — Ambulatory Visit (INDEPENDENT_AMBULATORY_CARE_PROVIDER_SITE_OTHER): Payer: BC Managed Care – PPO | Admitting: *Deleted

## 2014-02-24 DIAGNOSIS — A539 Syphilis, unspecified: Secondary | ICD-10-CM

## 2014-02-24 MED ORDER — PENICILLIN G BENZATHINE 1200000 UNIT/2ML IM SUSP
1.2000 10*6.[IU] | Freq: Once | INTRAMUSCULAR | Status: AC
  Administered 2014-02-24: 1.2 10*6.[IU] via INTRAMUSCULAR

## 2014-03-04 LAB — HIV-1 GENOTYPR PLUS

## 2014-03-31 ENCOUNTER — Encounter: Payer: Self-pay | Admitting: Internal Medicine

## 2014-03-31 ENCOUNTER — Ambulatory Visit (INDEPENDENT_AMBULATORY_CARE_PROVIDER_SITE_OTHER): Payer: BC Managed Care – PPO | Admitting: Internal Medicine

## 2014-03-31 VITALS — BP 164/98 | HR 74 | Temp 97.1°F | Wt 201.0 lb

## 2014-03-31 DIAGNOSIS — Z23 Encounter for immunization: Secondary | ICD-10-CM

## 2014-03-31 DIAGNOSIS — B20 Human immunodeficiency virus [HIV] disease: Secondary | ICD-10-CM

## 2014-03-31 LAB — CBC WITH DIFFERENTIAL/PLATELET
BASOS ABS: 0.1 10*3/uL (ref 0.0–0.1)
Basophils Relative: 2 % — ABNORMAL HIGH (ref 0–1)
EOS ABS: 0.1 10*3/uL (ref 0.0–0.7)
Eosinophils Relative: 2 % (ref 0–5)
HCT: 47.1 % (ref 39.0–52.0)
Hemoglobin: 16.1 g/dL (ref 13.0–17.0)
LYMPHS ABS: 1.6 10*3/uL (ref 0.7–4.0)
LYMPHS PCT: 43 % (ref 12–46)
MCH: 30.4 pg (ref 26.0–34.0)
MCHC: 34.2 g/dL (ref 30.0–36.0)
MCV: 89 fL (ref 78.0–100.0)
Monocytes Absolute: 0.3 10*3/uL (ref 0.1–1.0)
Monocytes Relative: 8 % (ref 3–12)
NEUTROS PCT: 45 % (ref 43–77)
Neutro Abs: 1.7 10*3/uL (ref 1.7–7.7)
PLATELETS: 326 10*3/uL (ref 150–400)
RBC: 5.29 MIL/uL (ref 4.22–5.81)
RDW: 15.4 % (ref 11.5–15.5)
WBC: 3.7 10*3/uL — AB (ref 4.0–10.5)

## 2014-03-31 LAB — COMPLETE METABOLIC PANEL WITH GFR
ALT: 14 U/L (ref 0–53)
AST: 18 U/L (ref 0–37)
Albumin: 4.5 g/dL (ref 3.5–5.2)
Alkaline Phosphatase: 52 U/L (ref 39–117)
BUN: 9 mg/dL (ref 6–23)
CALCIUM: 9.6 mg/dL (ref 8.4–10.5)
CHLORIDE: 102 meq/L (ref 96–112)
CO2: 27 mEq/L (ref 19–32)
Creat: 1.34 mg/dL (ref 0.50–1.35)
GFR, Est African American: 83 mL/min
GFR, Est Non African American: 72 mL/min
Glucose, Bld: 78 mg/dL (ref 70–99)
Potassium: 4.4 mEq/L (ref 3.5–5.3)
SODIUM: 138 meq/L (ref 135–145)
TOTAL PROTEIN: 7.3 g/dL (ref 6.0–8.3)
Total Bilirubin: 0.9 mg/dL (ref 0.2–1.2)

## 2014-03-31 NOTE — Progress Notes (Signed)
   Subjective:    Patient ID: Luis Vincent, male    DOB: Mar 02, 1986, 28 y.o.   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 Started on stribild on 3/13. Doing well thus far a month on therapy.  Current Outpatient Prescriptions on File Prior to Visit  Medication Sig Dispense Refill  . elvitegravir-cobicistat-emtricitabine-tenofovir (STRIBILD) 150-150-200-300 MG TABS tablet Take 1 tablet by mouth daily with breakfast.  30 tablet  11  . ibuprofen (ADVIL,MOTRIN) 200 MG tablet Take 400 mg by mouth every 8 (eight) hours as needed (for pain.).       No current facility-administered medications on file prior to visit.   Active Ambulatory Problems    Diagnosis Date Noted  . HIV disease 02/17/2014  . Secondary syphilis in male 02/17/2014   Resolved Ambulatory Problems    Diagnosis Date Noted  . No Resolved Ambulatory Problems   Past Medical History  Diagnosis Date  . Headache        Review of Systems 10 point ros is negative    Objective:   Physical Exam BP 164/98  Pulse 74  Temp(Src) 97.1 F (36.2 C) (Oral)  Wt 201 lb (91.173 kg) Physical Exam  Constitutional: He is oriented to person, place, and time. He appears well-developed and well-nourished. No distress.  HENT:  Mouth/Throat: Oropharynx is clear and moist. No oropharyngeal exudate.  Cardiovascular: Normal rate, regular rhythm and normal heart sounds. Exam reveals no gallop and no friction rub.  No murmur heard.  Pulmonary/Chest: Effort normal and breath sounds normal. No respiratory distress. He has no wheezes.  Abdominal: Soft. Bowel sounds are normal. He exhibits no distension. There is no tenderness.  Lymphadenopathy:  He has no cervical adenopathy.  Neurological: He is alert and oriented to person, place, and time.  Skin: Skin is warm and dry. No rash noted. No erythema.  Psychiatric: He has a  normal mood and affect. His behavior is normal.        Assessment & Plan:  HIV = will get labs today to see if having appropriate decrease in VL while on stribild x 1 month.  RPR = will ensure he had last dose. Will need repeat RPR in Sept  Health maintenance = needs hep A booster  HTN = will follow next visit to see if need to add antihypertensive

## 2014-04-01 LAB — T-HELPER CELL (CD4) - (RCID CLINIC ONLY)
CD4 % Helper T Cell: 35 % (ref 33–55)
CD4 T Cell Abs: 650 /uL (ref 400–2700)

## 2014-04-04 LAB — HIV-1 RNA QUANT-NO REFLEX-BLD
HIV 1 RNA Quant: 369 copies/mL — ABNORMAL HIGH (ref <20)
HIV-1 RNA Quant, Log: 2.57 {Log} — ABNORMAL HIGH (ref <1.30)

## 2014-04-15 ENCOUNTER — Encounter: Payer: Self-pay | Admitting: Infectious Diseases

## 2014-04-16 ENCOUNTER — Ambulatory Visit (INDEPENDENT_AMBULATORY_CARE_PROVIDER_SITE_OTHER): Payer: BC Managed Care – PPO | Admitting: Family Medicine

## 2014-04-16 VITALS — BP 126/80 | HR 72 | Temp 98.2°F | Resp 16 | Ht 71.0 in | Wt 201.0 lb

## 2014-04-16 DIAGNOSIS — Z0000 Encounter for general adult medical examination without abnormal findings: Secondary | ICD-10-CM

## 2014-04-16 LAB — POCT UA - MICROSCOPIC ONLY
Casts, Ur, LPF, POC: NEGATIVE
Crystals, Ur, HPF, POC: NEGATIVE
Yeast, UA: NEGATIVE

## 2014-04-16 LAB — POCT URINALYSIS DIPSTICK
Bilirubin, UA: NEGATIVE
Blood, UA: NEGATIVE
Glucose, UA: NEGATIVE
Ketones, UA: NEGATIVE
Leukocytes, UA: NEGATIVE
Nitrite, UA: NEGATIVE
Protein, UA: 30
Spec Grav, UA: 1.02
Urobilinogen, UA: >=8
pH, UA: 7

## 2014-04-16 NOTE — Progress Notes (Signed)
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 male who is here for  Annual without DOT PE HE has no forms he works for [employer] and has  A DOT but is palnning to do part time work for a [employer] He was told to come in and do a PE He has HIV and syphilis and is doing well, followed by ID, Dr Baxter Flattery, compliant with antivirals No blood draws today, recently had it done, will bypass lipids etc    Past Medical History  Diagnosis Date  . Headache   . HIV infection   . Syphilis    History reviewed. No pertinent past surgical history. History   Social History  . Marital Status: Single    Spouse Name: N/A    Number of Children: N/A  . Years of Education: N/A   Social History Main Topics  . Smoking status: Never Smoker   . Smokeless tobacco: Never Used  . Alcohol Use: No  . Drug Use: No  . Sexual Activity: Not Currently   Other Topics Concern  . None   Social History Narrative  . None   History reviewed. No pertinent family history. No Known Allergies Prior to Admission medications   Medication Sig Start Date End Date Taking? Authorizing Provider  elvitegravir-cobicistat-emtricitabine-tenofovir (STRIBILD) 150-150-200-300 MG TABS tablet Take 1 tablet by mouth daily with breakfast. 02/17/14  Yes Carlyle Basques, MD  ibuprofen (ADVIL,MOTRIN) 200 MG tablet Take 400 mg by mouth every 8 (eight) hours as needed (for pain.).    Historical Provider, MD     ROS: The patient denies fevers, chills, night sweats, unintentional weight loss, chest pain, palpitations, wheezing, dyspnea on exertion, nausea, vomiting, abdominal pain, dysuria, hematuria, melena, numbness, weakness, or tingling.   All other systems have been reviewed and were otherwise negative with the exception of those mentioned in the HPI and as above.    PHYSICAL EXAM: Filed Vitals:   04/16/14 1432  BP: 126/80  Pulse: 72  Temp: 98.2 F (36.8 C)  Resp:  16   Filed Vitals:   04/16/14 1432  Height: 5\' 11"  (1.803 m)  Weight: 201 lb (91.173 kg)   Body mass index is 28.05 kg/(m^2).  General: Alert, no acute distress HEENT:  Normocephalic, atraumatic, oropharynx patent. EOMI, PERRLA, fundo exam nromal Cardiovascular:  Regular rate and rhythm, no rubs murmurs or gallops.  No Carotid bruits, radial pulse intact. No pedal edema.  Respiratory: Clear to auscultation bilaterally.  No wheezes, rales, or rhonchi.  No cyanosis, no use of accessory musculature GI: No organomegaly, abdomen is soft and non-tender, positive bowel sounds.  No masses. Skin: No rashes. Neurologic: Facial musculature symmetric. Psychiatric: Patient is appropriate throughout our interaction. Lymphatic: No cervical lymphadenopathy Musculoskeletal: Gait intact. UE and Abdullahi Vallone 5/5 st , 2/2 DTRS 5/5 strength UE and Agustina Witzke circ , no rashes or lesions , neg inguinal hernia He is able to see all his colors, vision is normal, hearing test 10/10 ft   LABS: Results for orders placed in visit on 04/16/14  POCT UA - MICROSCOPIC ONLY      Result Value Ref Range   WBC, Ur, HPF, POC 0-2     RBC, urine, microscopic 0-1     Bacteria, U Microscopic 1+     Mucus, UA 1+     Epithelial cells, urine per micros 1-3     Crystals, Ur, HPF, POC neg  Casts, Ur, LPF, POC neg     Yeast, UA neg     Amorphous, UA 4+    POCT URINALYSIS DIPSTICK      Result Value Ref Range   Color, UA yellow     Clarity, UA cloudy     Glucose, UA neg     Bilirubin, UA neg     Ketones, UA neg     Spec Grav, UA 1.020     Blood, UA neg     pH, UA 7.0     Protein, UA 30     Urobilinogen, UA >=8.0     Nitrite, UA neg     Leukocytes, UA Negative       EKG/XRAY:   Primary read interpreted by Dr. Marin Comment at [facility].   ASSESSMENT/PLAN: Encounter Diagnosis  Name Primary?  . Annual physical exam Yes   UA done No blood draw since recently done He is well overall has dx  HIV and syphilis F/u prn, if he needs DOT  then will fill out form for him since he is not sure if this is a DOT PE  For him or an annual, his new job just asked him to come and get it done.   Gross sideeffects, risk and benefits, and alternatives of medications d/w patient. Patient is aware that all medications have potential sideeffects and we are unable to predict every sideeffect or drug-drug interaction that may occur.  Glenford Bayley, DO 04/16/2014 4:11 PM

## 2014-05-25 ENCOUNTER — Other Ambulatory Visit: Payer: BC Managed Care – PPO

## 2014-05-25 DIAGNOSIS — B20 Human immunodeficiency virus [HIV] disease: Secondary | ICD-10-CM

## 2014-05-25 LAB — CBC WITH DIFFERENTIAL/PLATELET
BASOS ABS: 0.1 10*3/uL (ref 0.0–0.1)
Basophils Relative: 2 % — ABNORMAL HIGH (ref 0–1)
EOS ABS: 0 10*3/uL (ref 0.0–0.7)
Eosinophils Relative: 1 % (ref 0–5)
HEMATOCRIT: 45.1 % (ref 39.0–52.0)
HEMOGLOBIN: 15.6 g/dL (ref 13.0–17.0)
Lymphocytes Relative: 48 % — ABNORMAL HIGH (ref 12–46)
Lymphs Abs: 1.6 10*3/uL (ref 0.7–4.0)
MCH: 31 pg (ref 26.0–34.0)
MCHC: 34.6 g/dL (ref 30.0–36.0)
MCV: 89.5 fL (ref 78.0–100.0)
MONO ABS: 0.4 10*3/uL (ref 0.1–1.0)
MONOS PCT: 11 % (ref 3–12)
NEUTROS ABS: 1.3 10*3/uL — AB (ref 1.7–7.7)
Neutrophils Relative %: 38 % — ABNORMAL LOW (ref 43–77)
Platelets: 291 10*3/uL (ref 150–400)
RBC: 5.04 MIL/uL (ref 4.22–5.81)
RDW: 14.8 % (ref 11.5–15.5)
WBC: 3.4 10*3/uL — ABNORMAL LOW (ref 4.0–10.5)

## 2014-05-25 LAB — COMPREHENSIVE METABOLIC PANEL
ALBUMIN: 4.3 g/dL (ref 3.5–5.2)
ALT: 12 U/L (ref 0–53)
AST: 15 U/L (ref 0–37)
Alkaline Phosphatase: 53 U/L (ref 39–117)
BILIRUBIN TOTAL: 1.2 mg/dL (ref 0.2–1.2)
BUN: 9 mg/dL (ref 6–23)
CO2: 27 mEq/L (ref 19–32)
CREATININE: 1.55 mg/dL — AB (ref 0.50–1.35)
Calcium: 9.5 mg/dL (ref 8.4–10.5)
Chloride: 103 mEq/L (ref 96–112)
GLUCOSE: 80 mg/dL (ref 70–99)
POTASSIUM: 4.4 meq/L (ref 3.5–5.3)
Sodium: 137 mEq/L (ref 135–145)
Total Protein: 7.1 g/dL (ref 6.0–8.3)

## 2014-05-26 LAB — T-HELPER CELL (CD4) - (RCID CLINIC ONLY)
CD4 T CELL HELPER: 36 % (ref 33–55)
CD4 T Cell Abs: 600 /uL (ref 400–2700)

## 2014-05-28 LAB — HIV-1 RNA QUANT-NO REFLEX-BLD
HIV 1 RNA QUANT: 37 {copies}/mL — AB (ref <20)
HIV-1 RNA Quant, Log: 1.57 {Log} — ABNORMAL HIGH (ref <1.30)

## 2014-06-08 ENCOUNTER — Ambulatory Visit (INDEPENDENT_AMBULATORY_CARE_PROVIDER_SITE_OTHER): Payer: BC Managed Care – PPO | Admitting: Internal Medicine

## 2014-06-08 ENCOUNTER — Encounter: Payer: Self-pay | Admitting: Internal Medicine

## 2014-06-08 VITALS — BP 129/71 | HR 82 | Temp 97.5°F | Wt 204.0 lb

## 2014-06-08 DIAGNOSIS — Z0000 Encounter for general adult medical examination without abnormal findings: Secondary | ICD-10-CM

## 2014-06-08 DIAGNOSIS — B20 Human immunodeficiency virus [HIV] disease: Secondary | ICD-10-CM

## 2014-06-08 DIAGNOSIS — A539 Syphilis, unspecified: Secondary | ICD-10-CM

## 2014-06-08 NOTE — Progress Notes (Signed)
   Subjective:    Patient ID: Luis Vincent, male    DOB: [birth year], 28 y.o.   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 Started on stribild on 3/13, now here for 3.5 month visit on stribild. CD 4 count of 600/VL 37 (June 2015). Great adherence. Late on one dose. excercising with mom walking 3 miles per day  Currently in relationship with hiv negative partner and wanted to know what was safe to do in terms of oral sex, rimming, and anal sex  Current Outpatient Prescriptions on File Prior to Visit  Medication Sig Dispense Refill  . elvitegravir-cobicistat-emtricitabine-tenofovir (STRIBILD) 150-150-200-300 MG TABS tablet Take 1 tablet by mouth daily with breakfast.  30 tablet  11  . ibuprofen (ADVIL,MOTRIN) 200 MG tablet Take 400 mg by mouth every 8 (eight) hours as needed (for pain.).       No current facility-administered medications on file prior to visit.   Active Ambulatory Problems    Diagnosis Date Noted  . HIV disease 02/17/2014  . Secondary syphilis in male 02/17/2014   Resolved Ambulatory Problems    Diagnosis Date Noted  . No Resolved Ambulatory Problems   Past Medical History  Diagnosis Date  . Headache   . HIV infection   . Syphilis      Review of Systems 10 point ros is negative    Objective:   Physical Exam BP 129/71  Pulse 82  Temp(Src) 97.5 F (36.4 C) (Oral)  Wt 204 lb (92.534 kg) Physical Exam  Constitutional: He is oriented to person, place, and time. He appears well-developed and well-nourished. No distress.  HENT:  Mouth/Throat: Oropharynx is clear and moist. No oropharyngeal exudate. braces Lymphadenopathy:  He has no cervical adenopathy.  Neurological: He is alert and oriented to person, place, and time.  Skin: Skin is warm and dry. No rash noted. No erythema.  Psychiatric: He has a normal mood and affect. His  behavior is normal.        Assessment & Plan:  hiv = nearly undetectable after 3 months of stribild. Continue with great adherence  Latent Syphilis = will recheck rpr at next visit to see that he has adequate response to therapy  Pre-hypertension = no need for anti-hypertensive at this time  hiv prevention = discussed the various forms of sex and which acts bears low to high risk and use of condoms  Health maintenance = flu vaccine at next visit  rtc in 3 months

## 2014-08-25 ENCOUNTER — Other Ambulatory Visit: Payer: BC Managed Care – PPO

## 2014-08-26 ENCOUNTER — Other Ambulatory Visit: Payer: BC Managed Care – PPO

## 2014-08-26 DIAGNOSIS — B20 Human immunodeficiency virus [HIV] disease: Secondary | ICD-10-CM

## 2014-08-26 LAB — CBC WITH DIFFERENTIAL/PLATELET
Basophils Absolute: 0 10*3/uL (ref 0.0–0.1)
Basophils Relative: 1 % (ref 0–1)
EOS PCT: 4 % (ref 0–5)
Eosinophils Absolute: 0.2 10*3/uL (ref 0.0–0.7)
HEMATOCRIT: 44.5 % (ref 39.0–52.0)
HEMOGLOBIN: 15.2 g/dL (ref 13.0–17.0)
LYMPHS ABS: 2.1 10*3/uL (ref 0.7–4.0)
LYMPHS PCT: 43 % (ref 12–46)
MCH: 30.5 pg (ref 26.0–34.0)
MCHC: 34.2 g/dL (ref 30.0–36.0)
MCV: 89.4 fL (ref 78.0–100.0)
MONO ABS: 0.3 10*3/uL (ref 0.1–1.0)
Monocytes Relative: 6 % (ref 3–12)
NEUTROS ABS: 2.3 10*3/uL (ref 1.7–7.7)
Neutrophils Relative %: 46 % (ref 43–77)
Platelets: 291 10*3/uL (ref 150–400)
RBC: 4.98 MIL/uL (ref 4.22–5.81)
RDW: 14.4 % (ref 11.5–15.5)
WBC: 4.9 10*3/uL (ref 4.0–10.5)

## 2014-08-26 LAB — COMPREHENSIVE METABOLIC PANEL
ALBUMIN: 4.4 g/dL (ref 3.5–5.2)
ALK PHOS: 53 U/L (ref 39–117)
ALT: 16 U/L (ref 0–53)
AST: 15 U/L (ref 0–37)
BUN: 12 mg/dL (ref 6–23)
CO2: 26 meq/L (ref 19–32)
Calcium: 9.6 mg/dL (ref 8.4–10.5)
Chloride: 104 mEq/L (ref 96–112)
Creat: 1.35 mg/dL (ref 0.50–1.35)
GLUCOSE: 83 mg/dL (ref 70–99)
POTASSIUM: 4.3 meq/L (ref 3.5–5.3)
SODIUM: 139 meq/L (ref 135–145)
TOTAL PROTEIN: 6.8 g/dL (ref 6.0–8.3)
Total Bilirubin: 1 mg/dL (ref 0.2–1.2)

## 2014-08-26 LAB — T-HELPER CELL (CD4) - (RCID CLINIC ONLY)
CD4 % Helper T Cell: 39 % (ref 33–55)
CD4 T CELL ABS: 860 /uL (ref 400–2700)

## 2014-08-27 LAB — HIV-1 RNA QUANT-NO REFLEX-BLD: HIV 1 RNA Quant: <20 copies/mL (ref <20)

## 2014-09-08 ENCOUNTER — Encounter: Payer: Self-pay | Admitting: Internal Medicine

## 2014-09-08 ENCOUNTER — Ambulatory Visit (INDEPENDENT_AMBULATORY_CARE_PROVIDER_SITE_OTHER): Payer: BC Managed Care – PPO | Admitting: Internal Medicine

## 2014-09-08 VITALS — BP 148/82 | HR 62 | Temp 97.2°F | Wt 205.0 lb

## 2014-09-08 DIAGNOSIS — B20 Human immunodeficiency virus [HIV] disease: Secondary | ICD-10-CM

## 2014-09-08 DIAGNOSIS — A539 Syphilis, unspecified: Secondary | ICD-10-CM | POA: Diagnosis not present

## 2014-09-08 DIAGNOSIS — Z23 Encounter for immunization: Secondary | ICD-10-CM

## 2014-09-08 LAB — RPR TITER

## 2014-09-08 LAB — RPR: RPR Ser Ql: REACTIVE — AB

## 2014-09-08 NOTE — Progress Notes (Signed)
Patient ID: Luis Vincent, male   DOB: 01/23/1986, 28 y.o.   MRN: [id number]       Patient ID: Luis Vincent, male   DOB: 08/22/1986, 28 y.o.   MRN: [id number]  HPI Luis Vincent is a 28yo M with well controlled HIV disease, CD 4 count of 860/VL<20, on stribild. Also has hx of secondary syphilis treated in the spring 2015. Doing well with meds. Missed one dose. Otherwise in good state of health.  Outpatient Encounter Prescriptions as of 09/08/2014  Medication Sig  . elvitegravir-cobicistat-emtricitabine-tenofovir (STRIBILD) 150-150-200-300 MG TABS tablet Take 1 tablet by mouth daily with breakfast.  . ibuprofen (ADVIL,MOTRIN) 200 MG tablet Take 400 mg by mouth every 8 (eight) hours as needed (for pain.).     Patient Active Problem List   Diagnosis Date Noted  . HIV disease 02/17/2014  . Secondary syphilis in male 02/17/2014     Health Maintenance Due  Topic Date Due  . Tetanus/tdap  09/21/2004  . Influenza Vaccine  07/09/2014     Review of Systems 10 point ros is negative Physical Exam   BP 148/82  Pulse 62  Temp(Src) 97.2 F (36.2 C) (Oral)  Wt 205 lb (92.987 kg) Physical Exam  Constitutional: He is oriented to person, place, and time. He appears well-developed and well-nourished. No distress.  HENT:  Mouth/Throat: Oropharynx is clear and moist. No oropharyngeal exudate.  Cardiovascular: Normal rate, regular rhythm and normal heart sounds. Exam reveals no gallop and no friction rub.  No murmur heard.  Pulmonary/Chest: Effort normal and breath sounds normal. No respiratory distress. He has no wheezes.  Abdominal: Soft. Bowel sounds are normal. He exhibits no distension. There is no tenderness.  Lymphadenopathy:  He has no cervical adenopathy.  Neurological: He is alert and oriented to person, place, and time.  Skin: Skin is warm and dry. No rash noted. No erythema.  Psychiatric: He has a normal mood and affect. His behavior is normal.    Lab Results  Component  Value Date   CD4TCELL 39 08/26/2014   Lab Results  Component Value Date   CD4TABS 860 08/26/2014   CD4TABS 600 05/25/2014   CD4TABS 650 03/31/2014   Lab Results  Component Value Date   HIV1RNAQUANT <20 08/26/2014   Lab Results  Component Value Date   HEPBSAB POS* 02/17/2014   No results found for this basename: RPR    CBC Lab Results  Component Value Date   WBC 4.9 08/26/2014   RBC 4.98 08/26/2014   HGB 15.2 08/26/2014   HCT 44.5 08/26/2014   PLT 291 08/26/2014   MCV 89.4 08/26/2014   MCH 30.5 08/26/2014   MCHC 34.2 08/26/2014   RDW 14.4 08/26/2014   LYMPHSABS 2.1 08/26/2014   MONOABS 0.3 08/26/2014   EOSABS 0.2 08/26/2014   BASOSABS 0.0 08/26/2014   BMET Lab Results  Component Value Date   NA 139 08/26/2014   K 4.3 08/26/2014   CL 104 08/26/2014   CO2 26 08/26/2014   GLUCOSE 83 08/26/2014   BUN 12 08/26/2014   CREATININE 1.35 08/26/2014   CALCIUM 9.6 08/26/2014   GFRNONAA 72 03/31/2014   GFRAA 83 03/31/2014     Assessment and Plan  hiv = well controlled  Hx of syphilis = need to get rpr today  Health maintenance = get flu shot today  hiv prevention = use condoms when they decide to have sex

## 2014-09-09 LAB — FLUORESCENT TREPONEMAL AB(FTA)-IGG-BLD: Fluorescent Treponemal ABS: REACTIVE — AB

## 2014-09-15 ENCOUNTER — Ambulatory Visit (INDEPENDENT_AMBULATORY_CARE_PROVIDER_SITE_OTHER): Payer: BC Managed Care – PPO | Admitting: Physician Assistant

## 2014-09-15 VITALS — BP 132/74 | HR 60 | Temp 97.3°F | Resp 14 | Ht 71.0 in | Wt 207.0 lb

## 2014-09-15 DIAGNOSIS — Z111 Encounter for screening for respiratory tuberculosis: Secondary | ICD-10-CM

## 2014-09-15 NOTE — Patient Instructions (Signed)
Please return within 49-72 hours for recheck of your TB

## 2014-09-15 NOTE — Progress Notes (Signed)
  Tuberculosis Risk Questionnaire  1. No Were you born outside the [place] in one of the following parts of the world: [place], [place], [place], [place] or [place]?    2. No Have you traveled outside the [place] and lived for more than one month in one of the following parts of the world: [place], [place], [place], [place] or [place]?    3. No Do you have a compromised immune system such as from any of the following conditions:HIV/AIDS, organ or bone marrow transplantation, diabetes, immunosuppressive medicines (e.g. Prednisone, Remicaide), leukemia, lymphoma, cancer of the head or neck, gastrectomy or jejunal bypass, end-stage renal disease (on dialysis), or silicosis?     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    5. No Have you ever: injected illegal drugs, used crack cocaine, lived in a homeless shelter  or been in jail or prison?     6. No Have you ever been exposed to anyone with infectious tuberculosis?    Tuberculosis Symptom Questionnaire  Do you currently have any of the following symptoms?  1. No Unexplained cough lasting more than 3 weeks?   2. No Unexplained fever lasting more than 3 weeks.   3. No Night Sweats (sweating that leaves the bedclothes and sheets wet)     4. No Shortness of Breath   5. No Chest Pain   6. No Unintentional weight loss    7. No Unexplained fatigue (very tired for no reason)

## 2014-09-15 NOTE — Progress Notes (Signed)
   Subjective:    Patient ID: Luis Vincent, male    DOB: 1986/01/24, 28 y.o.   MRN: [id number]  HPI This is a 28 year old male with a PMH of HIV and syphilis is here for a TB test for his new job in [place] home.  This is his first time having a TB test.  Orientation is tomorrow, 09/16/2014.     Review of Systems     Objective:   Physical Exam  Constitutional: He is oriented to person, place, and time. He appears well-developed and well-nourished.  HENT:  Head: Normocephalic and atraumatic.  Pulmonary/Chest: Effort normal. No respiratory distress.  Neurological: He is alert and oriented to person, place, and time.  Psychiatric: He has a normal mood and affect. His behavior is normal.  BP 132/74  Pulse 60  Temp(Src) 97.3 F (36.3 C) (Oral)  Resp 14  Ht 5\' 11"  (1.803 m)  Wt 207 lb (93.895 kg)  BMI 28.88 kg/m2  SpO2 97%         Assessment & Plan:  This is a 28 year old male with a PMH of HIV and syphilis is here for a TB test for employment.   Tuberculosis screening - Plan: TB Skin Test: Will return in 48-72 hours.  Ivar Drape, PA-C [facility] 10/8/20158:55 PM

## 2014-09-18 ENCOUNTER — Ambulatory Visit (INDEPENDENT_AMBULATORY_CARE_PROVIDER_SITE_OTHER): Payer: BC Managed Care – PPO | Admitting: Radiology

## 2014-09-18 DIAGNOSIS — Z111 Encounter for screening for respiratory tuberculosis: Secondary | ICD-10-CM

## 2014-09-18 LAB — TB SKIN TEST
Induration: 0 mm
TB Skin Test: NEGATIVE

## 2014-12-13 ENCOUNTER — Ambulatory Visit (INDEPENDENT_AMBULATORY_CARE_PROVIDER_SITE_OTHER): Payer: BC Managed Care – PPO | Admitting: Internal Medicine

## 2014-12-13 ENCOUNTER — Encounter: Payer: Self-pay | Admitting: Internal Medicine

## 2014-12-13 VITALS — BP 128/83 | HR 64 | Temp 97.3°F | Wt 201.0 lb

## 2014-12-13 DIAGNOSIS — Z23 Encounter for immunization: Secondary | ICD-10-CM | POA: Diagnosis not present

## 2014-12-13 DIAGNOSIS — B20 Human immunodeficiency virus [HIV] disease: Secondary | ICD-10-CM | POA: Diagnosis not present

## 2014-12-13 DIAGNOSIS — Z0000 Encounter for general adult medical examination without abnormal findings: Secondary | ICD-10-CM | POA: Diagnosis not present

## 2014-12-13 DIAGNOSIS — A5149 Other secondary syphilitic conditions: Secondary | ICD-10-CM

## 2014-12-13 NOTE — Progress Notes (Signed)
Patient ID: Luis Vincent, male   DOB: [birth year], 29 y.o.   MRN: [id number]       Patient ID: Luis Vincent, male   DOB: 03-08-1986, 29 y.o.   MRN: [id number]  HPI Luis Vincent is a 29yo M with HIV disease, cd 4 count 860/<20 (sep 2015), on stribild. Great adhernece. Denies having any illness since we last saw him 3 months ago. He also reports still with same partner as previous.  Outpatient Encounter Prescriptions as of 12/13/2014  Medication Sig  . elvitegravir-cobicistat-emtricitabine-tenofovir (STRIBILD) 150-150-200-300 MG TABS tablet Take 1 tablet by mouth daily with breakfast.     Patient Active Problem List   Diagnosis Date Noted  . HIV disease 02/17/2014  . Secondary syphilis in male 02/17/2014     Health Maintenance Due  Topic Date Due  . TETANUS/TDAP  09/21/2004     Review of Systems  Constitutional: Negative for fever, chills, diaphoresis, activity change, appetite change, fatigue and unexpected weight change.  HENT: Negative for congestion, sore throat, rhinorrhea, sneezing, trouble swallowing and sinus pressure.  Eyes: Negative for photophobia and visual disturbance.  Respiratory: Negative for cough, chest tightness, shortness of breath, wheezing and stridor.  Cardiovascular: Negative for chest pain, palpitations and leg swelling.  Gastrointestinal: Negative for nausea, vomiting, abdominal pain, diarrhea, constipation, blood in stool, abdominal distention and anal bleeding.  Genitourinary: Negative for dysuria, hematuria, flank pain and difficulty urinating.  Musculoskeletal: Negative for myalgias, back pain, joint swelling, arthralgias and gait problem.  Skin: Negative for color change, pallor, rash and wound.  Neurological: Negative for dizziness, tremors, weakness and light-headedness.  Hematological: Negative for adenopathy. Does not bruise/bleed easily.  Psychiatric/Behavioral: Negative for behavioral problems, confusion, sleep disturbance, dysphoric mood,  decreased concentration and agitation.    Physical Exam   BP 128/83 mmHg  Pulse 64  Temp(Src) 97.3 F (36.3 C) (Oral)  Wt 201 lb (91.173 kg) Physical Exam  Constitutional: He is oriented to person, place, and time. He appears well-developed and well-nourished. No distress.  HENT:  Mouth/Throat: Oropharynx is clear and moist. No oropharyngeal exudate.  Cardiovascular: Normal rate, regular rhythm and normal heart sounds. Exam reveals no gallop and no friction rub.  No murmur heard.  Pulmonary/Chest: Effort normal and breath sounds normal. No respiratory distress. He has no wheezes.  Abdominal: Soft. Bowel sounds are normal. He exhibits no distension. There is no tenderness.  Lymphadenopathy:  He has no cervical adenopathy.  Neurological: He is alert and oriented to person, place, and time.  Skin: Skin is warm and dry. No rash noted. No erythema.  Psychiatric: He has a normal mood and affect. His behavior is normal.    Lab Results  Component Value Date   CD4TCELL 39 08/26/2014   Lab Results  Component Value Date   CD4TABS 860 08/26/2014   CD4TABS 600 05/25/2014   CD4TABS 650 03/31/2014   Lab Results  Component Value Date   HIV1RNAQUANT <20 08/26/2014   Lab Results  Component Value Date   HEPBSAB POS* 02/17/2014   No results found for: RPR  CBC Lab Results  Component Value Date   WBC 4.9 08/26/2014   RBC 4.98 08/26/2014   HGB 15.2 08/26/2014   HCT 44.5 08/26/2014   PLT 291 08/26/2014   MCV 89.4 08/26/2014   MCH 30.5 08/26/2014   MCHC 34.2 08/26/2014   RDW 14.4 08/26/2014   LYMPHSABS 2.1 08/26/2014   MONOABS 0.3 08/26/2014   EOSABS 0.2 08/26/2014   BASOSABS 0.0 08/26/2014   BMET Lab Results  Component Value Date   NA 139 08/26/2014   K 4.3 08/26/2014   CL 104 08/26/2014   CO2 26 08/26/2014   GLUCOSE 83 08/26/2014   BUN 12 08/26/2014   CREATININE 1.35 08/26/2014   CALCIUM 9.6 08/26/2014   GFRNONAA 72 03/31/2014   GFRAA 83 03/31/2014      Assessment and Plan  hiv = well controlled, will check labs in march  Hx of syphilis = will check albs in march, no new sexual encounters  Health maintenance = will give #2 hep A

## 2015-01-23 ENCOUNTER — Emergency Department (HOSPITAL_COMMUNITY)
Admission: EM | Admit: 2015-01-23 | Discharge: 2015-01-23 | Disposition: A | Discharge status: Home / Self Care | Payer: BC Managed Care – PPO | Attending: Emergency Medicine | Admitting: Emergency Medicine

## 2015-01-23 DIAGNOSIS — S199XXA Unspecified injury of neck, initial encounter: Secondary | ICD-10-CM | POA: Diagnosis not present

## 2015-01-23 DIAGNOSIS — Z8619 Personal history of other infectious and parasitic diseases: Secondary | ICD-10-CM | POA: Diagnosis not present

## 2015-01-23 DIAGNOSIS — Y998 Other external cause status: Secondary | ICD-10-CM | POA: Diagnosis not present

## 2015-01-23 DIAGNOSIS — S3992XA Unspecified injury of lower back, initial encounter: Secondary | ICD-10-CM | POA: Insufficient documentation

## 2015-01-23 DIAGNOSIS — Z21 Asymptomatic human immunodeficiency virus [HIV] infection status: Secondary | ICD-10-CM | POA: Insufficient documentation

## 2015-01-23 DIAGNOSIS — Y92410 Unspecified street and highway as the place of occurrence of the external cause: Secondary | ICD-10-CM | POA: Insufficient documentation

## 2015-01-23 DIAGNOSIS — Z79899 Other long term (current) drug therapy: Secondary | ICD-10-CM | POA: Diagnosis not present

## 2015-01-23 DIAGNOSIS — Y9389 Activity, other specified: Secondary | ICD-10-CM | POA: Diagnosis not present

## 2015-01-23 DIAGNOSIS — S59919A Unspecified injury of unspecified forearm, initial encounter: Secondary | ICD-10-CM | POA: Insufficient documentation

## 2015-01-23 DIAGNOSIS — R519 Headache, unspecified: Secondary | ICD-10-CM

## 2015-01-23 DIAGNOSIS — S0990XA Unspecified injury of head, initial encounter: Secondary | ICD-10-CM | POA: Insufficient documentation

## 2015-01-23 DIAGNOSIS — R51 Headache: Secondary | ICD-10-CM
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
## 2015-03-09 ENCOUNTER — Other Ambulatory Visit: Payer: Self-pay | Admitting: Internal Medicine

## 2015-03-09 DIAGNOSIS — B20 Human immunodeficiency virus [HIV] disease: Secondary | ICD-10-CM

## 2015-03-29 ENCOUNTER — Encounter: Payer: Self-pay | Admitting: Infectious Disease

## 2015-03-29 ENCOUNTER — Ambulatory Visit (INDEPENDENT_AMBULATORY_CARE_PROVIDER_SITE_OTHER): Payer: BC Managed Care – PPO | Admitting: Infectious Disease

## 2015-03-29 VITALS — BP 121/66 | HR 64 | Temp 97.8°F | Wt 183.0 lb

## 2015-03-29 DIAGNOSIS — B20 Human immunodeficiency virus [HIV] disease: Secondary | ICD-10-CM

## 2015-03-29 DIAGNOSIS — Z113 Encounter for screening for infections with a predominantly sexual mode of transmission: Secondary | ICD-10-CM | POA: Diagnosis not present

## 2015-03-29 DIAGNOSIS — Z79899 Other long term (current) drug therapy: Secondary | ICD-10-CM | POA: Diagnosis not present

## 2015-03-29 DIAGNOSIS — B354 Tinea corporis: Secondary | ICD-10-CM | POA: Insufficient documentation

## 2015-03-29 LAB — CBC WITH DIFFERENTIAL/PLATELET
Basophils Absolute: 0.1 10*3/uL (ref 0.0–0.1)
Basophils Relative: 2 % — ABNORMAL HIGH (ref 0–1)
Eosinophils Absolute: 0.2 10*3/uL (ref 0.0–0.7)
Eosinophils Relative: 5 % (ref 0–5)
HCT: 45.5 % (ref 39.0–52.0)
HEMOGLOBIN: 15.2 g/dL (ref 13.0–17.0)
LYMPHS PCT: 56 % — AB (ref 12–46)
Lymphs Abs: 2.4 10*3/uL (ref 0.7–4.0)
MCH: 30.5 pg (ref 26.0–34.0)
MCHC: 33.4 g/dL (ref 30.0–36.0)
MCV: 91.2 fL (ref 78.0–100.0)
MONO ABS: 0.3 10*3/uL (ref 0.1–1.0)
MPV: 9.4 fL (ref 8.6–12.4)
Monocytes Relative: 7 % (ref 3–12)
Neutro Abs: 1.3 10*3/uL — ABNORMAL LOW (ref 1.7–7.7)
Neutrophils Relative %: 30 % — ABNORMAL LOW (ref 43–77)
Platelets: 287 10*3/uL (ref 150–400)
RBC: 4.99 MIL/uL (ref 4.22–5.81)
RDW: 14.5 % (ref 11.5–15.5)
WBC: 4.2 10*3/uL (ref 4.0–10.5)

## 2015-03-29 LAB — COMPLETE METABOLIC PANEL WITH GFR
ALK PHOS: 50 U/L (ref 39–117)
ALT: 11 U/L (ref 0–53)
AST: 11 U/L (ref 0–37)
Albumin: 4 g/dL (ref 3.5–5.2)
BILIRUBIN TOTAL: 0.6 mg/dL (ref 0.2–1.2)
BUN: 9 mg/dL (ref 6–23)
CHLORIDE: 104 meq/L (ref 96–112)
CO2: 25 mEq/L (ref 19–32)
CREATININE: 1.34 mg/dL (ref 0.50–1.35)
Calcium: 9.6 mg/dL (ref 8.4–10.5)
GFR, EST AFRICAN AMERICAN: 82 mL/min
GFR, Est Non African American: 71 mL/min
Glucose, Bld: 56 mg/dL — ABNORMAL LOW (ref 70–99)
Potassium: 4.2 mEq/L (ref 3.5–5.3)
Sodium: 139 mEq/L (ref 135–145)
Total Protein: 6.6 g/dL (ref 6.0–8.3)

## 2015-03-29 LAB — LIPID PANEL
CHOL/HDL RATIO: 5 ratio
Cholesterol: 214 mg/dL — ABNORMAL HIGH (ref 0–200)
HDL: 43 mg/dL (ref >=40)
LDL CALC: 156 mg/dL — AB (ref 0–99)
Triglycerides: 74 mg/dL (ref <150)
VLDL: 15 mg/dL (ref 0–40)

## 2015-03-29 MED ORDER — ELVITEG-COBIC-EMTRICIT-TENOFAF 150-150-200-10 MG PO TABS: 1.0000 | ORAL_TABLET | Freq: Every day | ORAL | Status: DC

## 2015-03-29 NOTE — Progress Notes (Signed)
   Subjective:    Patient ID: Luis Vincent, male    DOB: 09-25-1986, 29 y.o.   MRN: [id number]  HPI  29 year old African-American man with perfectly controlled HIV on STRIBILD here for labs.  In the past he has had perfect virological suppression and healthy CD4 count  Lab Results  Component Value Date   HIV1RNAQUANT <20 08/26/2014   Lab Results  Component Value Date   CD4TABS 860 08/26/2014   CD4TABS 600 05/25/2014   CD4TABS 650 03/31/2014    He also has had persistence of a rash around the base of his neck since he first was diagnosed with HIV is concerned about this. It is not especially pruritic see pictures below.  Review of Systems  Constitutional: Negative for fever, chills, diaphoresis, activity change, appetite change, fatigue and unexpected weight change.  HENT: Negative for congestion, rhinorrhea, sinus pressure, sneezing, sore throat and trouble swallowing.   Eyes: Negative for photophobia and visual disturbance.  Respiratory: Negative for cough, chest tightness, shortness of breath, wheezing and stridor.   Cardiovascular: Negative for chest pain, palpitations and leg swelling.  Gastrointestinal: Negative for nausea, vomiting, abdominal pain, diarrhea, constipation, blood in stool, abdominal distention and anal bleeding.  Genitourinary: Negative for dysuria, hematuria, flank pain and difficulty urinating.  Musculoskeletal: Negative for myalgias, back pain, joint swelling, arthralgias and gait problem.  Skin: Positive for rash. Negative for color change, pallor and wound.  Neurological: Negative for dizziness, tremors, weakness and light-headedness.  Hematological: Negative for adenopathy. Does not bruise/bleed easily.  Psychiatric/Behavioral: Negative for behavioral problems, confusion, sleep disturbance, dysphoric mood, decreased concentration and agitation.       Objective:   Physical Exam  Constitutional: He is oriented to person, place, and time. He appears  well-developed and well-nourished.  HENT:  Head: Normocephalic and atraumatic.  Eyes: Conjunctivae and EOM are normal.  Neck: Normal range of motion. Neck supple.  Cardiovascular: Normal rate and regular rhythm.   Pulmonary/Chest: Effort normal. No respiratory distress. He has no wheezes.  Abdominal: Soft. He exhibits no distension.  Musculoskeletal: Normal range of motion. He exhibits no edema or tenderness.  Neurological: He is alert and oriented to person, place, and time.  Skin: Skin is warm and dry. Rash noted. No erythema.   03/29/2015:  Scaly hyperpigmented rash:           Assessment & Plan:   HIV: change to [place] from [place]. I spent greater than 25 minutes with the patient including greater than 50% of time in face to face counsel of the patient Re: Petra Kuba of GENVOYA versus TRIUMEQ superiority in terms of kidney and bone safety. Also explained how it otherwise exactly the same medicine and is to be taken the same way.( Patient is highly adherent to his medications never misses a dose in a 24-hour window. Sometimes is off by few hours but otherwise by his account 100% compliant) and in coordination of their care.  Rash: try topical antifungal such as Lamisil in case this is a fungal infection such as tinea corporis

## 2015-03-30 LAB — RPR: RPR Ser Ql: REACTIVE — AB

## 2015-03-30 LAB — FLUORESCENT TREPONEMAL AB(FTA)-IGG-BLD: Fluorescent Treponemal ABS: REACTIVE — AB

## 2015-03-30 LAB — URINE CYTOLOGY ANCILLARY ONLY
CHLAMYDIA, DNA PROBE: NEGATIVE
NEISSERIA GONORRHEA: NEGATIVE

## 2015-03-30 LAB — T-HELPER CELL (CD4) - (RCID CLINIC ONLY)
CD4 T CELL ABS: 930 /uL (ref 400–2700)
CD4 T CELL HELPER: 37 % (ref 33–55)

## 2015-03-30 LAB — RPR TITER: RPR Titer: 1:2 {titer}

## 2015-03-31 LAB — HIV-1 RNA QUANT-NO REFLEX-BLD
HIV 1 RNA Quant: 23 copies/mL — ABNORMAL HIGH (ref <20)
HIV-1 RNA QUANT, LOG: 1.36 {Log} — AB (ref <1.30)

## 2015-05-22 ENCOUNTER — Other Ambulatory Visit: Payer: Self-pay | Admitting: *Deleted

## 2015-05-22 DIAGNOSIS — B20 Human immunodeficiency virus [HIV] disease: Secondary | ICD-10-CM

## 2015-05-22 MED ORDER — ELVITEG-COBIC-EMTRICIT-TENOFAF 150-150-200-10 MG PO TABS: 1.0000 | ORAL_TABLET | Freq: Every day | ORAL | Status: DC

## 2015-07-30 ENCOUNTER — Encounter (HOSPITAL_COMMUNITY): Payer: Self-pay | Admitting: *Deleted

## 2015-07-30 ENCOUNTER — Emergency Department (HOSPITAL_COMMUNITY)
Admission: EM | Admit: 2015-07-30 | Discharge: 2015-07-31 | Discharge status: Against Medical Advice | Payer: BC Managed Care – PPO | Attending: Emergency Medicine | Admitting: Emergency Medicine

## 2015-07-30 DIAGNOSIS — R42 Dizziness and giddiness: Secondary | ICD-10-CM | POA: Insufficient documentation

## 2015-07-30 DIAGNOSIS — R531 Weakness: Secondary | ICD-10-CM | POA: Insufficient documentation

## 2015-07-30 LAB — BASIC METABOLIC PANEL
Anion gap: 8 (ref 5–15)
BUN: 8 mg/dL (ref 6–20)
CHLORIDE: 101 mmol/L (ref 101–111)
CO2: 28 mmol/L (ref 22–32)
CREATININE: 1.34 mg/dL — AB (ref 0.61–1.24)
Calcium: 9.4 mg/dL (ref 8.9–10.3)
GFR calc Af Amer: >60 mL/min (ref >60)
GFR calc non Af Amer: >60 mL/min (ref >60)
GLUCOSE: 87 mg/dL (ref 65–99)
POTASSIUM: 3.3 mmol/L — AB (ref 3.5–5.1)
SODIUM: 137 mmol/L (ref 135–145)

## 2015-07-30 LAB — CBC
HEMATOCRIT: 44.1 % (ref 39.0–52.0)
Hemoglobin: 15.5 g/dL (ref 13.0–17.0)
MCH: 31.1 pg (ref 26.0–34.0)
MCHC: 35.1 g/dL (ref 30.0–36.0)
MCV: 88.4 fL (ref 78.0–100.0)
Platelets: 286 10*3/uL (ref 150–400)
RBC: 4.99 MIL/uL (ref 4.22–5.81)
RDW: 12.1 % (ref 11.5–15.5)
WBC: 4.6 10*3/uL (ref 4.0–10.5)

## 2015-07-30 MED ORDER — ONDANSETRON HCL 4 MG/2ML IJ SOLN: 4.0000 mg | Freq: Once | INTRAMUSCULAR | Status: DC

## 2015-07-30 MED ORDER — SODIUM CHLORIDE 0.9 % IV BOLUS (SEPSIS): 1000.0000 mL | Freq: Once | INTRAVENOUS | Status: DC

## 2015-07-30 NOTE — ED Notes (Signed)
Pt c/o weakness and dizziness since yesterday. Reports nausea but no vomiting, denies diarrhea. Denies pain.

## 2015-07-30 NOTE — ED Notes (Signed)
No answer x3 to go to room.

## 2015-07-30 NOTE — ED Notes (Signed)
Pt called for a room x1 no answer  

## 2015-07-30 NOTE — ED Notes (Signed)
Called patient, unable to locate patient at this time. 

## 2015-08-21 ENCOUNTER — Other Ambulatory Visit: Payer: BC Managed Care – PPO

## 2015-09-26 ENCOUNTER — Ambulatory Visit: Payer: BC Managed Care – PPO | Admitting: Internal Medicine

## 2015-09-26 ENCOUNTER — Telehealth: Payer: Self-pay | Admitting: *Deleted

## 2015-09-26 NOTE — Telephone Encounter (Signed)
Message left requesting pt call for a new appt.

## 2015-10-30 ENCOUNTER — Emergency Department (HOSPITAL_COMMUNITY)
Admission: EM | Admit: 2015-10-30 | Discharge: 2015-10-31 | Disposition: A | Discharge status: Home / Self Care | Payer: Self-pay | Attending: Emergency Medicine | Admitting: Emergency Medicine

## 2015-10-30 ENCOUNTER — Emergency Department (HOSPITAL_COMMUNITY): Payer: BC Managed Care – PPO

## 2015-10-30 ENCOUNTER — Encounter (HOSPITAL_COMMUNITY): Payer: Self-pay | Admitting: *Deleted

## 2015-10-30 DIAGNOSIS — J322 Chronic ethmoidal sinusitis: Secondary | ICD-10-CM | POA: Insufficient documentation

## 2015-10-30 DIAGNOSIS — H538 Other visual disturbances: Secondary | ICD-10-CM | POA: Insufficient documentation

## 2015-10-30 DIAGNOSIS — R51 Headache: Secondary | ICD-10-CM

## 2015-10-30 DIAGNOSIS — R42 Dizziness and giddiness: Secondary | ICD-10-CM | POA: Insufficient documentation

## 2015-10-30 DIAGNOSIS — Z8619 Personal history of other infectious and parasitic diseases: Secondary | ICD-10-CM | POA: Insufficient documentation

## 2015-10-30 DIAGNOSIS — R110 Nausea: Secondary | ICD-10-CM | POA: Insufficient documentation

## 2015-10-30 DIAGNOSIS — R519 Headache, unspecified: Secondary | ICD-10-CM

## 2015-10-30 DIAGNOSIS — Z21 Asymptomatic human immunodeficiency virus [HIV] infection status: Secondary | ICD-10-CM | POA: Insufficient documentation

## 2015-10-30 DIAGNOSIS — Z79899 Other long term (current) drug therapy: Secondary | ICD-10-CM | POA: Insufficient documentation

## 2015-10-30 MED ORDER — KETOROLAC TROMETHAMINE 30 MG/ML IJ SOLN
30.0000 mg | Freq: Once | INTRAMUSCULAR | Status: AC
  Administered 2015-10-30: 30 mg via INTRAVENOUS
  Filled 2015-10-30: qty 1

## 2015-10-30 MED ORDER — METOCLOPRAMIDE HCL 5 MG/ML IJ SOLN
10.0000 mg | Freq: Once | INTRAMUSCULAR | Status: AC
  Administered 2015-10-30: 10 mg via INTRAVENOUS
  Filled 2015-10-30: qty 2

## 2015-10-30 MED ORDER — SODIUM CHLORIDE 0.9 % IV BOLUS (SEPSIS)
1000.0000 mL | Freq: Once | INTRAVENOUS | Status: AC
  Administered 2015-10-30: 1000 mL via INTRAVENOUS

## 2015-10-30 MED ORDER — DIPHENHYDRAMINE HCL 50 MG/ML IJ SOLN
12.5000 mg | Freq: Once | INTRAMUSCULAR | Status: AC
  Administered 2015-10-30: 12.5 mg via INTRAVENOUS
  Filled 2015-10-30: qty 1

## 2015-10-30 NOTE — ED Provider Notes (Signed)
CSN: [id number]     Arrival date & time 10/30/15  1739 History   First MD Initiated Contact with Patient 10/30/15 2047     Chief Complaint  Patient presents with  . Headache     (Consider location/radiation/quality/duration/timing/severity/associated sxs/prior Treatment) HPI Comments: This is a 29 year old African-American male with a history of HIV and headaches.  States that for the past week he's had a persistent headache that waxes and wanes in intensity.  He has been taking over-the-counter ibuprofen or Advil every 4-6 hours.  He did call the ID clinic/primary care physician today.  He set up an appointment for next week, but he felt he could not wait that long.  He denies any recent URI symptoms, but he does report that he's been getting dizzy with this headache, which is different, as well as having nausea and photophobia  Patient is a 29 y.o. male presenting with headaches. The history is provided by the patient.  Headache Pain location:  Frontal Quality:  Sharp and stabbing Severity currently:  6/10 Severity at highest:  9/10 Onset quality:  Gradual Duration:  1 week Timing:  Intermittent Progression:  Worsening Chronicity:  Recurrent Similar to prior headaches: no   Context: bright light   Relieved by:  NSAIDs Worsened by:  Nothing Ineffective treatments:  None tried Associated symptoms: dizziness, nausea and photophobia   Associated symptoms: no congestion and no fever     Past Medical History  Diagnosis Date  . Headache   . HIV infection ([place])   . Syphilis    History reviewed. No pertinent past surgical history. History reviewed. No pertinent family history. Social History  Substance Use Topics  . Smoking status: Never Smoker   . Smokeless tobacco: Never Used  . Alcohol Use: No    Review of Systems  Constitutional: Negative for fever and chills.  HENT: Negative for congestion and rhinorrhea.   Eyes: Positive for photophobia. Negative for visual  disturbance.  Respiratory: Negative for shortness of breath.   Gastrointestinal: Positive for nausea.  Neurological: Positive for dizziness and headaches.  All other systems reviewed and are negative.     Allergies  Review of patient's allergies indicates no known allergies.  Home Medications   Prior to Admission medications   Medication Sig Start Date End Date Taking? Authorizing Provider  elvitegravir-cobicistat-emtricitabine-tenofovir (GENVOYA) 150-150-200-10 MG TABS tablet Take 1 tablet by mouth daily with breakfast. 05/22/15  Yes Carlyle Basques, MD  traMADol (ULTRAM) 50 MG tablet Take 1 tablet (50 mg total) by mouth once. 10/31/15   Junius Creamer, NP   BP 121/63 mmHg  Pulse 81  Temp(Src) 98.1 F (36.7 C) (Oral)  Resp 18  Ht 6' (1.829 m)  Wt 87.147 kg  BMI 26.05 kg/m2  SpO2 99% Physical Exam  Constitutional: He is oriented to person, place, and time. He appears well-developed and well-nourished.  HENT:  Head: Normocephalic.  Right Ear: External ear normal.  Left Ear: External ear normal.  Mouth/Throat: Oropharynx is clear and moist.  Eyes: Pupils are equal, round, and reactive to light.  Neck: Normal range of motion.  Cardiovascular: Normal rate and regular rhythm.   Pulmonary/Chest: Effort normal and breath sounds normal.  Abdominal: Soft.  Musculoskeletal: Normal range of motion.  Neurological: He is alert and oriented to person, place, and time.  Skin: Skin is warm and dry. No erythema. No pallor.  Nursing note and vitals reviewed.   ED Course  Procedures (including critical care time) Labs Review Labs Reviewed - No  data to display  Imaging Review Ct Head Wo Contrast  10/31/2015  CLINICAL DATA:  Acute onset of frontal headache and dizziness. Initial encounter. EXAM: CT HEAD WITHOUT CONTRAST TECHNIQUE: Contiguous axial images were obtained from the base of the skull through the vertex without intravenous contrast. COMPARISON:  None. FINDINGS: There is no  evidence of acute infarction, mass lesion, or intra- or extra-axial hemorrhage on CT. The posterior fossa, including the cerebellum, brainstem and fourth ventricle, is within normal limits. The third and lateral ventricles, and basal ganglia are unremarkable in appearance. The cerebral hemispheres are symmetric in appearance, with normal gray-white differentiation. No mass effect or midline shift is seen. There is no evidence of fracture; visualized osseous structures are unremarkable in appearance. The visualized portions of the orbits are within normal limits. There is opacification of the frontal sinuses, and partial opacification of the ethmoid air cells and sphenoid sinus. The mastoid air cells are well-aerated. No significant soft tissue abnormalities are seen. IMPRESSION: 1. No acute intracranial pathology seen on CT. 2. Opacification of the frontal sinuses, and partial opacification of the ethmoid air cells and sphenoid sinus. Electronically Signed   By: Garald Balding M.D.   On: 10/31/2015 00:04   I have personally reviewed and evaluated these images and lab results as part of my medical decision-making.   EKG Interpretation None     CT scan shows that the patient has chronic sinusitis.  He has been referred to ENT for further evaluation.  He's been given a prescription for Ultram for pain control and instructed.  Follow-up with his PCP as well  MDM   Final diagnoses:  Nonintractable headache, unspecified chronicity pattern, unspecified headache type  Sinusitis chronic, ethmoidal         Junius Creamer, NP 10/31/15 0031  Charlesetta Shanks, MD 11/06/15 1439

## 2015-10-30 NOTE — ED Notes (Signed)
Pt reports having a headache for over a week. Denies n/v but is having sensitivity to light and feels lightheaded. Denies hx of migraines. No relief with ibuprofen.

## 2015-10-30 NOTE — ED Notes (Signed)
Patient transported to CT 

## 2015-10-31 MED ORDER — TRAMADOL HCL 50 MG PO TABS
50.0000 mg | ORAL_TABLET | Freq: Once | ORAL | Status: AC
  Administered 2015-10-31: 50 mg via ORAL
  Filled 2015-10-31: qty 1

## 2015-10-31 MED ORDER — TRAMADOL HCL 50 MG PO TABS: 50.0000 mg | ORAL_TABLET | Freq: Once | ORAL | Status: DC

## 2015-10-31 NOTE — Discharge Instructions (Signed)
CT scan shows that you have chronic sinusitis.  You have been referred to an ENT specialist for further evaluation.  You have been given a prescription for Ultram for severe pain.  He can continue taking Tylenol, ibuprofen for moderate

## 2016-02-05 ENCOUNTER — Other Ambulatory Visit: Payer: Self-pay | Admitting: *Deleted

## 2016-02-05 ENCOUNTER — Other Ambulatory Visit: Payer: Self-pay

## 2016-02-05 ENCOUNTER — Other Ambulatory Visit: Payer: Self-pay | Admitting: Infectious Disease

## 2016-02-05 DIAGNOSIS — Z79899 Other long term (current) drug therapy: Secondary | ICD-10-CM

## 2016-02-05 DIAGNOSIS — B20 Human immunodeficiency virus [HIV] disease: Secondary | ICD-10-CM

## 2016-02-05 DIAGNOSIS — Z113 Encounter for screening for infections with a predominantly sexual mode of transmission: Secondary | ICD-10-CM

## 2016-02-05 LAB — COMPLETE METABOLIC PANEL WITH GFR
ALBUMIN: 4.4 g/dL (ref 3.6–5.1)
ALK PHOS: 50 U/L (ref 40–115)
ALT: 12 U/L (ref 9–46)
AST: 17 U/L (ref 10–40)
BILIRUBIN TOTAL: 1.1 mg/dL (ref 0.2–1.2)
BUN: 10 mg/dL (ref 7–25)
CO2: 29 mmol/L (ref 20–31)
CREATININE: 1.19 mg/dL (ref 0.60–1.35)
Calcium: 9.7 mg/dL (ref 8.6–10.3)
Chloride: 102 mmol/L (ref 98–110)
GFR, Est African American: >89 mL/min (ref >=60)
GFR, Est Non African American: 81 mL/min (ref >=60)
GLUCOSE: 61 mg/dL — AB (ref 65–99)
Potassium: 3.9 mmol/L (ref 3.5–5.3)
SODIUM: 139 mmol/L (ref 135–146)
TOTAL PROTEIN: 7.1 g/dL (ref 6.1–8.1)

## 2016-02-05 LAB — CBC WITH DIFFERENTIAL/PLATELET
BASOS ABS: 0 10*3/uL (ref 0.0–0.1)
BASOS PCT: 1 % (ref 0–1)
Eosinophils Absolute: 0.2 10*3/uL (ref 0.0–0.7)
Eosinophils Relative: 4 % (ref 0–5)
HCT: 44.8 % (ref 39.0–52.0)
HEMOGLOBIN: 14.6 g/dL (ref 13.0–17.0)
Lymphocytes Relative: 49 % — ABNORMAL HIGH (ref 12–46)
Lymphs Abs: 2.4 10*3/uL (ref 0.7–4.0)
MCH: 29.1 pg (ref 26.0–34.0)
MCHC: 32.6 g/dL (ref 30.0–36.0)
MCV: 89.4 fL (ref 78.0–100.0)
MPV: 9.5 fL (ref 8.6–12.4)
Monocytes Absolute: 0.3 10*3/uL (ref 0.1–1.0)
Monocytes Relative: 7 % (ref 3–12)
NEUTROS ABS: 1.9 10*3/uL (ref 1.7–7.7)
NEUTROS PCT: 39 % — AB (ref 43–77)
Platelets: 304 10*3/uL (ref 150–400)
RBC: 5.01 MIL/uL (ref 4.22–5.81)
RDW: 14.1 % (ref 11.5–15.5)
WBC: 4.9 10*3/uL (ref 4.0–10.5)

## 2016-02-05 LAB — LIPID PANEL
Cholesterol: 220 mg/dL — ABNORMAL HIGH (ref 125–200)
HDL: 52 mg/dL (ref >=40)
LDL CALC: 156 mg/dL — AB (ref <130)
Total CHOL/HDL Ratio: 4.2 Ratio (ref <=5.0)
Triglycerides: 58 mg/dL (ref <150)
VLDL: 12 mg/dL (ref <30)

## 2016-02-05 LAB — HEPATITIS C ANTIBODY: HCV AB: NEGATIVE

## 2016-02-05 MED ORDER — ELVITEG-COBIC-EMTRICIT-TENOFAF 150-150-200-10 MG PO TABS: 1.0000 | ORAL_TABLET | Freq: Every day | ORAL | Status: DC

## 2016-02-06 ENCOUNTER — Other Ambulatory Visit: Payer: Self-pay | Admitting: Infectious Disease

## 2016-02-06 DIAGNOSIS — B20 Human immunodeficiency virus [HIV] disease: Secondary | ICD-10-CM

## 2016-02-06 LAB — HIV-1 RNA QUANT-NO REFLEX-BLD
HIV 1 RNA QUANT: 25883 {copies}/mL — AB (ref <20)
HIV-1 RNA QUANT, LOG: 4.41 {Log_copies}/mL — AB (ref <1.30)

## 2016-02-06 LAB — T-HELPER CELL (CD4) - (RCID CLINIC ONLY)
CD4 T CELL ABS: 980 /uL (ref 400–2700)
CD4 T CELL HELPER: 38 % (ref 33–55)

## 2016-02-07 LAB — RPR: RPR Ser Ql: REACTIVE — AB

## 2016-02-07 LAB — FLUORESCENT TREPONEMAL AB(FTA)-IGG-BLD: Fluorescent Treponemal ABS: REACTIVE — AB

## 2016-02-07 LAB — RPR TITER

## 2016-02-08 ENCOUNTER — Ambulatory Visit: Payer: Self-pay

## 2016-02-20 LAB — HIV-1 GENOTYPING (RTI,PI,IN INHBTR): HIV-1 Genotype: DETECTED — AB

## 2016-02-22 ENCOUNTER — Ambulatory Visit: Payer: Self-pay | Admitting: Internal Medicine

## 2016-05-31 ENCOUNTER — Other Ambulatory Visit: Payer: Self-pay | Admitting: Internal Medicine

## 2016-05-31 DIAGNOSIS — B20 Human immunodeficiency virus [HIV] disease: Secondary | ICD-10-CM

## 2016-08-05 ENCOUNTER — Ambulatory Visit: Payer: Self-pay

## 2016-08-28 ENCOUNTER — Ambulatory Visit: Payer: Self-pay | Admitting: Infectious Disease

## 2016-10-21 ENCOUNTER — Ambulatory Visit: Payer: Self-pay

## 2016-11-04 ENCOUNTER — Ambulatory Visit: Payer: Self-pay

## 2016-11-23 ENCOUNTER — Emergency Department (HOSPITAL_COMMUNITY): Payer: Self-pay

## 2016-11-23 ENCOUNTER — Encounter (HOSPITAL_COMMUNITY): Payer: Self-pay | Admitting: Emergency Medicine

## 2016-11-23 ENCOUNTER — Emergency Department (HOSPITAL_COMMUNITY)
Admission: EM | Admit: 2016-11-23 | Discharge: 2016-11-23 | Disposition: A | Discharge status: Home / Self Care | Payer: Self-pay | Attending: Emergency Medicine | Admitting: Emergency Medicine

## 2016-11-23 DIAGNOSIS — X501XXA Overexertion from prolonged static or awkward postures, initial encounter: Secondary | ICD-10-CM | POA: Insufficient documentation

## 2016-11-23 DIAGNOSIS — M79605 Pain in left leg: Secondary | ICD-10-CM | POA: Insufficient documentation

## 2016-11-23 DIAGNOSIS — Y939 Activity, unspecified: Secondary | ICD-10-CM | POA: Insufficient documentation

## 2016-11-23 DIAGNOSIS — Y990 Civilian activity done for income or pay: Secondary | ICD-10-CM | POA: Insufficient documentation

## 2016-11-23 DIAGNOSIS — Y929 Unspecified place or not applicable: Secondary | ICD-10-CM | POA: Insufficient documentation

## 2016-11-23 MED ORDER — NAPROXEN 500 MG PO TABS: 500.0000 mg | ORAL_TABLET | Freq: Two times a day (BID) | ORAL | 0 refills | Status: DC

## 2016-11-23 MED ORDER — HYDROCODONE-ACETAMINOPHEN 5-325 MG PO TABS
1.0000 | ORAL_TABLET | Freq: Once | ORAL | Status: AC
  Administered 2016-11-23: 1 via ORAL
  Filled 2016-11-23: qty 1

## 2016-11-23 MED ORDER — METHOCARBAMOL 500 MG PO TABS
1000.0000 mg | ORAL_TABLET | Freq: Once | ORAL | Status: AC
  Administered 2016-11-23: 1000 mg via ORAL
  Filled 2016-11-23: qty 2

## 2016-11-23 MED ORDER — METHOCARBAMOL 500 MG PO TABS: 1000.0000 mg | ORAL_TABLET | Freq: Two times a day (BID) | ORAL | 0 refills | Status: DC

## 2016-11-23 NOTE — ED Triage Notes (Signed)
Patient here with complaints of left leg injury. 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 10/10 upon movement.

## 2016-11-23 NOTE — Discharge Instructions (Signed)
Your x-rays today were negative for fracture and dislocations.    Please take naproxen 500 mg twice a daily for pain.   Methocarbamol (robaxin) is a muscle relaxer which may help with and muscle tightness.  You may take this medication twice a day as well.  Please do not drive after taking this medication as it may cause drowsiness.    Use your crutches as needed for ambulation.   Return to emergency room if your symptoms do not improve or you develop numbness, tingling or weakness of your extremity

## 2016-11-23 NOTE — ED Provider Notes (Signed)
Patient seen and evaluated. D/W PA-C C. Donney Rankins.Patient reports pain in his left mid anterior thigh. He was standing on the edge of a [employer] truck holding a tire. He stepped backwards. He stepped on his left leg first. He felt sudden pain in his left anterior thigh. No hip pain. No knee pain. No swelling. No bruising. No fall or injury. States he is walking with a limp. No past similar episodes.  His M shows a soft compartment. He has intact flexion extension of the hip and the knee. No swelling of the knee no tenderness. No tenderness over the hip. X-rays the femur show incidental synovial defect. No fracture or abnormal bony structure noted. Discussion think this very likely muscle skeletal pain. Home. Crutches. She'll increase weightbearing. Return if worsening symptoms.     Tanna Furry, MD 11/23/16 1356

## 2016-11-23 NOTE — ED Provider Notes (Signed)
[facility] DEPT Provider Note  CSN: [id number] Arrival date & time: 11/23/16  1157  History   Chief Complaint Chief Complaint  Patient presents with  . Leg Injury    HPI Luis Vincent is a 30 y.o. male with pmh as noted below presents with acute 10/10 L thigh pain that started this morning 1045 while pt was working.  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  Pt had grabbed a tire out of the truck and twisted his body to the left to place tire on cart when pt felt immediate L thigh pain.  Pt then straightened his leg out and heard a pop.  Denies previous injury or fracture to L femur, knee or hip.  Pt denies recent fevers.  Pt states he was able to ambulate after incident with a limp due to pain.  HPI  Past Medical History:  Diagnosis Date  . Headache   . HIV infection ([place])   . Syphilis     Patient Active Problem List   Diagnosis Date Noted  . Tinea corporis 03/29/2015  . HIV disease ([place]) 02/17/2014  . 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 No pertinent surgical history.     Home Medications    Prior to Admission medications   Medication Sig Start Date End Date Taking? Authorizing Provider  GENVOYA 150-150-200-10 MG TABS tablet TAKE 1 TABLET BY MOUTH WITH BREAKFAST Patient not taking: Reported on 11/23/2016 05/31/16   Carlyle Basques, MD  methocarbamol (ROBAXIN) 500 MG tablet Take 2 tablets (1,000 mg total) by mouth 2 (two) times daily. 11/23/16   Kinnie Feil, PA-C  naproxen (NAPROSYN) 500 MG tablet Take 1 tablet (500 mg total) by mouth 2 (two) times daily. 11/23/16   Kinnie Feil, PA-C  traMADol (ULTRAM) 50 MG tablet Take 1 tablet (50 mg total) by mouth once. Patient not taking: Reported on 11/23/2016 10/31/15   Junius Creamer, NP    Family History History reviewed. No pertinent family history.  Social History Social History  Substance Use Topics  . Smoking status: Never Smoker  . Smokeless tobacco:  Never Used  . Alcohol use No     Allergies   Patient has no known allergies.   Review of Systems Review of Systems  Constitutional: Negative for fever.  Eyes: Negative for visual disturbance.  Respiratory: Negative for shortness of breath.   Cardiovascular: Negative for chest pain.  Gastrointestinal: Negative for abdominal pain, constipation, diarrhea, nausea and vomiting.  Genitourinary: Negative for difficulty urinating, dysuria and hematuria.  Musculoskeletal: Positive for gait problem (due to left thigh pain). Negative for back pain.  Neurological: Negative for weakness and headaches.  Psychiatric/Behavioral: Negative.      Physical Exam Updated Vital Signs BP 176/92 (BP Location: Right Arm)   Pulse 94   Temp 98.5 F (36.9 C) (Oral)   Resp 20   SpO2 98%   Physical Exam  Constitutional: He is oriented to person, place, and time. Vital signs are normal. He appears well-developed and well-nourished. No distress.  HENT:  Head: Normocephalic and atraumatic.  Eyes: EOM are normal. Pupils are equal, round, and reactive to light.  Neck: Normal range of motion.  Cardiovascular: Normal rate, regular rhythm, normal heart sounds and intact distal pulses.   Pulmonary/Chest: Effort normal and breath sounds normal.  Musculoskeletal: Normal range of motion.  Antalgic gait, favoring L side.  Decrease L hip flexion and extension due to pain.  Tenderness reported with  deep palpation of lateral L and anterior thigh.  Normal L knee and ankle ROM.  No point tenderness at L hip, knee or ankle.  No tenderness at SI joint.  Neurological: He is alert and oriented to person, place, and time.  Decrease L hip flexion strength against resistance compared to R. Sensation to light touch intact in bilateral lower extremities.  No groin numbness.  Skin: Skin is warm and dry. Capillary refill takes less than 2 seconds.  Psychiatric: He has a normal mood and affect. His behavior is normal.  Nursing  note and vitals reviewed.    ED Treatments / Results  Labs (all labs ordered are listed, but only abnormal results are displayed) Labs Reviewed - No data to display  EKG  EKG Interpretation None       Radiology Dg Femur Min 2 Views Left  Result Date: 11/23/2016 CLINICAL DATA:  Left thigh pain following a twisting injury. EXAM: LEFT FEMUR 2 VIEWS COMPARISON:  None. FINDINGS: Incidental synovial erosion in the femoral neck. Otherwise, normal appearing bones and soft tissues. No fracture or dislocation seen. IMPRESSION: No fracture. Electronically Signed   By: Claudie Revering M.D.   On: 11/23/2016 12:58    Procedures Procedures (including critical care time)  Medications Ordered in ED Medications  HYDROcodone-acetaminophen (NORCO/VICODIN) 5-325 MG per tablet 1 tablet (1 tablet Oral Given 11/23/16 1307)  methocarbamol (ROBAXIN) tablet 1,000 mg (1,000 mg Oral Given 11/23/16 1307)     Initial Impression / Assessment and Plan / ED Course  I have reviewed the triage vital signs and the nursing notes.  Pertinent labs & imaging results that were available during my care of the patient were reviewed by me and considered in my medical decision making (see chart for details).  Clinical Course    30 yo male with pmh as noted above presents with L thigh pain after twisting injury while carrying a tire. Although pt reports 10/10 pain there are no obvious bony abnormalities, pt is able to put weight on L leg.  Pt reports pain with L hip flexion and abduction.  CXR negative for fractures and dislocations.  Pt notified about incidental femoral neck synovial erosion, encouraged PCP follow up.  Pt will be discharged with naprosyn, robaxin, crutches.  Pt evaluated by Dr. Jeneen Rinks who agrees with dispo plan.  ED return instructions given, pt verbalized understanding and agreed to dispo plan.   Final Clinical Impressions(s) / ED Diagnoses   Final diagnoses:  Left leg pain    New Prescriptions New  Prescriptions   METHOCARBAMOL (ROBAXIN) 500 MG TABLET    Take 2 tablets (1,000 mg total) by mouth 2 (two) times daily.   NAPROXEN (NAPROSYN) 500 MG TABLET    Take 1 tablet (500 mg total) by mouth 2 (two) times daily.     Kinnie Feil, PA-C 11/23/16 1344    Tanna Furry, MD 11/28/16 1728

## 2016-11-23 NOTE — ED Notes (Signed)
Bed: [id number] Expected date:  Expected time:  Means of arrival:  Comments: 30 yo leg injury;unable to bear weight

## 2016-11-25 ENCOUNTER — Other Ambulatory Visit: Payer: Self-pay | Admitting: *Deleted

## 2016-11-25 DIAGNOSIS — Z21 Asymptomatic human immunodeficiency virus [HIV] infection status: Secondary | ICD-10-CM

## 2016-11-25 DIAGNOSIS — B20 Human immunodeficiency virus [HIV] disease: Secondary | ICD-10-CM

## 2016-11-26 ENCOUNTER — Other Ambulatory Visit (INDEPENDENT_AMBULATORY_CARE_PROVIDER_SITE_OTHER): Payer: Self-pay

## 2016-11-26 DIAGNOSIS — Z113 Encounter for screening for infections with a predominantly sexual mode of transmission: Secondary | ICD-10-CM

## 2016-11-26 DIAGNOSIS — B20 Human immunodeficiency virus [HIV] disease: Secondary | ICD-10-CM

## 2016-11-26 LAB — CBC WITH DIFFERENTIAL/PLATELET
BASOS ABS: 60 {cells}/uL (ref 0–200)
Basophils Relative: 1 %
EOS ABS: 180 {cells}/uL (ref 15–500)
EOS PCT: 3 %
HEMATOCRIT: 46 % (ref 38.5–50.0)
HEMOGLOBIN: 14.8 g/dL (ref 13.2–17.1)
LYMPHS ABS: 2580 {cells}/uL (ref 850–3900)
Lymphocytes Relative: 43 %
MCH: 29.2 pg (ref 27.0–33.0)
MCHC: 32.2 g/dL (ref 32.0–36.0)
MCV: 90.7 fL (ref 80.0–100.0)
MONO ABS: 420 {cells}/uL (ref 200–950)
MPV: 9.2 fL (ref 7.5–12.5)
Monocytes Relative: 7 %
NEUTROS ABS: 2760 {cells}/uL (ref 1500–7800)
NEUTROS PCT: 46 %
Platelets: 339 10*3/uL (ref 140–400)
RBC: 5.07 MIL/uL (ref 4.20–5.80)
RDW: 13.3 % (ref 11.0–15.0)
WBC: 6 10*3/uL (ref 3.8–10.8)

## 2016-11-27 ENCOUNTER — Encounter: Payer: Self-pay | Admitting: Infectious Disease

## 2016-11-27 LAB — COMPLETE METABOLIC PANEL WITH GFR
ALBUMIN: 4 g/dL (ref 3.6–5.1)
ALK PHOS: 48 U/L (ref 40–115)
ALT: 14 U/L (ref 9–46)
AST: 16 U/L (ref 10–40)
BILIRUBIN TOTAL: 0.9 mg/dL (ref 0.2–1.2)
BUN: 11 mg/dL (ref 7–25)
CALCIUM: 9.4 mg/dL (ref 8.6–10.3)
CO2: 25 mmol/L (ref 20–31)
Chloride: 107 mmol/L (ref 98–110)
Creat: 1.24 mg/dL (ref 0.60–1.35)
GFR, EST NON AFRICAN AMERICAN: 77 mL/min (ref >=60)
GFR, Est African American: 89 mL/min (ref >=60)
GLUCOSE: 82 mg/dL (ref 65–99)
POTASSIUM: 4.1 mmol/L (ref 3.5–5.3)
SODIUM: 142 mmol/L (ref 135–146)
TOTAL PROTEIN: 6.9 g/dL (ref 6.1–8.1)

## 2016-11-27 LAB — T-HELPER CELL (CD4) - (RCID CLINIC ONLY)
CD4 T CELL ABS: 850 /uL (ref 400–2700)
CD4 T CELL HELPER: 36 % (ref 33–55)

## 2016-11-27 LAB — MICROALBUMIN / CREATININE URINE RATIO
CREATININE, URINE: 447 mg/dL — AB (ref 20–370)
MICROALB UR: 2.3 mg/dL
MICROALB/CREAT RATIO: 5 ug/mg{creat} (ref <30)

## 2016-11-27 LAB — RPR

## 2016-11-28 LAB — HIV-1 RNA QUANT-NO REFLEX-BLD
HIV 1 RNA Quant: 10957 copies/mL — ABNORMAL HIGH (ref <20)
HIV-1 RNA QUANT, LOG: 4.04 {Log_copies}/mL — AB (ref <1.30)

## 2016-11-28 LAB — URINE CYTOLOGY ANCILLARY ONLY
CHLAMYDIA, DNA PROBE: NEGATIVE
NEISSERIA GONORRHEA: NEGATIVE

## 2016-12-11 ENCOUNTER — Ambulatory Visit (INDEPENDENT_AMBULATORY_CARE_PROVIDER_SITE_OTHER): Payer: Self-pay | Admitting: Infectious Disease

## 2016-12-11 ENCOUNTER — Encounter: Payer: Self-pay | Admitting: Infectious Disease

## 2016-12-11 VITALS — BP 132/72 | HR 72 | Temp 98.2°F | Ht 71.0 in | Wt 204.0 lb

## 2016-12-11 DIAGNOSIS — B20 Human immunodeficiency virus [HIV] disease: Secondary | ICD-10-CM

## 2016-12-11 DIAGNOSIS — A5149 Other secondary syphilitic conditions: Secondary | ICD-10-CM

## 2016-12-11 DIAGNOSIS — Z23 Encounter for immunization: Secondary | ICD-10-CM

## 2016-12-11 MED ORDER — ABACAVIR-DOLUTEGRAVIR-LAMIVUD 600-50-300 MG PO TABS: 1.0000 | ORAL_TABLET | Freq: Every day | ORAL | 11 refills | Status: DC

## 2016-12-11 MED ORDER — ABACAVIR-DOLUTEGRAVIR-LAMIVUD 600-50-300 MG PO TABS: 1.0000 | ORAL_TABLET | Freq: Every day | ORAL | 2 refills | Status: DC

## 2016-12-11 NOTE — Progress Notes (Signed)
Subjective:    Patient ID: Luis Vincent, male    DOB: Sep 16, 1986, 30 y.o.   MRN: [id number]  HPI  31 year old African-American man with previously  perfectly controlled HIV on STRIBILD here for labs.  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 He claims that he takes his medications until they have run out when his scripts have not been refillable due to the fact that he did not renew his ADAP application. I'm concerned that there could be other patterns of poor insurance and that GENVOYA is not the right drug for him.  Lab Results  Component Value Date   HIV1RNAQUANT 10,957 (H) 11/26/2016   HIV1RNAQUANT 25,883 (H) 02/05/2016   HIV1RNAQUANT 23 (H) 03/29/2015     Lab Results  Component Value Date   CD4TABS 850 11/26/2016   CD4TABS 980 02/05/2016   CD4TABS 930 03/29/2015   Past Medical History:  Diagnosis Date  . Headache   . HIV infection ([place])   . Syphilis     No past surgical history on file.  No family history on file.    Social History   Social History  . Marital status: Single    Spouse name: N/A  . Number of children: N/A  . Years of education: N/A   Social History Main Topics  . Smoking status: Never Smoker  . Smokeless tobacco: Never Used  . Alcohol use No  . Drug use: No  . Sexual activity: Not Currently   Other Topics Concern  . None   Social History Narrative  . None    No Known Allergies   Current Outpatient Prescriptions:  .  abacavir-dolutegravir-lamiVUDine (TRIUMEQ) 600-50-300 MG tablet, Take 1 tablet by mouth daily., Disp: 30 tablet, Rfl: 2    Review of Systems  Constitutional: Negative for activity change, appetite change, chills, diaphoresis, fatigue, fever and unexpected weight change.  HENT: Negative for congestion, rhinorrhea, sinus pressure, sneezing, sore throat and trouble swallowing.   Eyes: Negative for photophobia and visual disturbance.  Respiratory: Negative  for cough, chest tightness, shortness of breath, wheezing and stridor.   Cardiovascular: Negative for chest pain, palpitations and leg swelling.  Gastrointestinal: Negative for abdominal distention, abdominal pain, anal bleeding, blood in stool, constipation, diarrhea, nausea and vomiting.  Genitourinary: Negative for difficulty urinating, dysuria, flank pain and hematuria.  Musculoskeletal: Negative for arthralgias, back pain, gait problem, joint swelling and myalgias.  Skin: Positive for rash. Negative for color change, pallor and wound.  Neurological: Negative for dizziness, tremors, weakness and light-headedness.  Hematological: Negative for adenopathy. Does not bruise/bleed easily.  Psychiatric/Behavioral: Negative for agitation, behavioral problems, confusion, decreased concentration, dysphoric mood and sleep disturbance.       Objective:   Physical Exam  Constitutional: He is oriented to person, place, and time. He appears well-developed and well-nourished.  HENT:  Head: Normocephalic and atraumatic.  Eyes: Conjunctivae and EOM are normal.  Neck: Normal range of motion. Neck supple.  Cardiovascular: Normal rate and regular rhythm.   Pulmonary/Chest: Effort normal. No respiratory distress. He has no wheezes.  Abdominal: Soft. He exhibits no distension.  Musculoskeletal: Normal range of motion. He exhibits no edema or tenderness.  Neurological: He is alert and oriented to person, place, and time.  Skin: Skin is warm and dry. Rash noted. No erythema.         Assessment & Plan:   HIV: Change to [place], check labs today and then in 4-6 weeks' time.  Need for vaccinations given vaccine for meningococcus and influenza today and then second meningococcal vaccine in 8 weeks' time.  Syphilis titers: come down  I spent greater than 25 minutes with the patient including greater than 50% of time in face to face counsel of the patient guarding the importance of adherence to his  antiretroviral medications need for high barrier genetic resistance in his antiretroviral regimen and in coordination of his  care.

## 2016-12-11 NOTE — Addendum Note (Signed)
Addended by: Landis Gandy on: 12/11/2016 05:16 PM   Modules accepted: Orders

## 2016-12-11 NOTE — Patient Instructions (Signed)
We need to give you a vaccine for meningitis today and in 8 weeks  I would like to change you to [place] with food instead of Genvoya  We need repeat labs on you in about 4-6 weeks after you have started [place]  If ADAP is not ready we can [place] to getyour meds

## 2016-12-16 ENCOUNTER — Encounter: Payer: Self-pay | Admitting: Infectious Disease

## 2017-02-21 ENCOUNTER — Encounter: Payer: Self-pay | Admitting: Infectious Disease

## 2017-03-14 ENCOUNTER — Other Ambulatory Visit: Payer: Self-pay | Admitting: *Deleted

## 2017-03-14 DIAGNOSIS — B20 Human immunodeficiency virus [HIV] disease: Secondary | ICD-10-CM

## 2017-03-14 MED ORDER — ABACAVIR-DOLUTEGRAVIR-LAMIVUD 600-50-300 MG PO TABS: 1.0000 | ORAL_TABLET | Freq: Every day | ORAL | 2 refills | Status: DC

## 2017-03-14 NOTE — Telephone Encounter (Signed)
Call from [facility] in [place] for updated phone info on patient. We have the same # and when I called patient he said he did not recognize the number so did not pick up. He will reach out to them. Asked if he had a lapse in his medication and he said no, he has been picking it up at the [facility] on [address] and that [place] it there.

## 2017-03-19 ENCOUNTER — Other Ambulatory Visit: Payer: Self-pay | Admitting: *Deleted

## 2017-03-19 DIAGNOSIS — B20 Human immunodeficiency virus [HIV] disease: Secondary | ICD-10-CM

## 2017-03-19 MED ORDER — ABACAVIR-DOLUTEGRAVIR-LAMIVUD 600-50-300 MG PO TABS: 1.0000 | ORAL_TABLET | Freq: Every day | ORAL | 2 refills | Status: DC

## 2017-06-03 ENCOUNTER — Other Ambulatory Visit: Payer: Self-pay

## 2017-06-09 ENCOUNTER — Other Ambulatory Visit: Payer: Self-pay

## 2017-06-09 LAB — CBC WITH DIFFERENTIAL/PLATELET
BASOS ABS: 56 {cells}/uL (ref 0–200)
Basophils Relative: 1 %
EOS ABS: 392 {cells}/uL (ref 15–500)
Eosinophils Relative: 7 %
HEMATOCRIT: 44.7 % (ref 38.5–50.0)
HEMOGLOBIN: 14.6 g/dL (ref 13.2–17.1)
LYMPHS ABS: 2464 {cells}/uL (ref 850–3900)
Lymphocytes Relative: 44 %
MCH: 29.7 pg (ref 27.0–33.0)
MCHC: 32.7 g/dL (ref 32.0–36.0)
MCV: 90.9 fL (ref 80.0–100.0)
MONO ABS: 392 {cells}/uL (ref 200–950)
MPV: 9.3 fL (ref 7.5–12.5)
Monocytes Relative: 7 %
NEUTROS PCT: 41 %
Neutro Abs: 2296 cells/uL (ref 1500–7800)
Platelets: 253 10*3/uL (ref 140–400)
RBC: 4.92 MIL/uL (ref 4.20–5.80)
RDW: 13.8 % (ref 11.0–15.0)
WBC: 5.6 10*3/uL (ref 3.8–10.8)

## 2017-06-09 LAB — COMPLETE METABOLIC PANEL WITH GFR
ALBUMIN: 4 g/dL (ref 3.6–5.1)
ALK PHOS: 62 U/L (ref 40–115)
ALT: 16 U/L (ref 9–46)
AST: 16 U/L (ref 10–40)
BUN: 15 mg/dL (ref 7–25)
CALCIUM: 9 mg/dL (ref 8.6–10.3)
CO2: 25 mmol/L (ref 20–31)
CREATININE: 1.35 mg/dL (ref 0.60–1.35)
Chloride: 105 mmol/L (ref 98–110)
GFR, Est African American: 80 mL/min (ref >=60)
GFR, Est Non African American: 69 mL/min (ref >=60)
Glucose, Bld: 76 mg/dL (ref 65–99)
POTASSIUM: 4.3 mmol/L (ref 3.5–5.3)
Sodium: 138 mmol/L (ref 135–146)
Total Bilirubin: 0.7 mg/dL (ref 0.2–1.2)
Total Protein: 6.6 g/dL (ref 6.1–8.1)

## 2017-06-09 NOTE — Progress Notes (Deleted)
HPI: Luis Vincent is a 31 y.o. male here for ADAP renewal. He complains of increased fatigue since starting Triumeq.  Allergies: No Known Allergies  Vitals:    Past Medical History: Past Medical History:  Diagnosis Date  . Headache   . HIV infection ([place])   . Syphilis     Social History: Social History   Social History  . Marital status: Single    Spouse name: N/A  . Number of children: N/A  . Years of education: N/A   Social History Main Topics  . Smoking status: Never Smoker  . Smokeless tobacco: Never Used  . Alcohol use No  . Drug use: No  . Sexual activity: Not Currently   Other Topics Concern  . Not on file   Social History Narrative  . No narrative on file    Previous Regimen: Genvoya  Current Regimen: Triumeq daily  Labs: HIV 1 RNA Quant (copies/mL)  Date Value  11/26/2016 10,957 (H)  02/05/2016 25,883 (H)  03/29/2015 23 (H)   CD4 T Cell Abs (/uL)  Date Value  11/26/2016 850  02/05/2016 980  03/29/2015 930   Hep B S Ab (no units)  Date Value  02/17/2014 POS (A)   Hepatitis B Surface Ag (no units)  Date Value  02/17/2014 NEGATIVE   HCV Ab (no units)  Date Value  02/05/2016 NEGATIVE    CrCl: CrCl cannot be calculated (Patient's most recent lab result is older than the maximum 21 days allowed.).  Lipids:    Component Value Date/Time   CHOL 220 (H) 02/05/2016 1123   TRIG 58 02/05/2016 1123   HDL 52 02/05/2016 1123   CHOLHDL 4.2 02/05/2016 1123   VLDL 12 02/05/2016 1123   LDLCALC 156 (H) 02/05/2016 1123    Assessment: Luis Vincent is a 31 year old male who presents today for ADAP renewal. During his visit he complained of increased fatigue when he takes Triumeq, pharmacy was asked to see the patient today. He was previously on Genvoya but due to spotty adherence he was changed to Triumeq. Since starting Triumeq he reports his adherence has been better and he does not miss doses.   Luis Vincent states he has noticed that when he  takes his Triumeq in the morning, a short time later he becomes tired. This feeling seems to fade throughout the day. He has started taking his Triumeq at night and states this has helped with the fatigue. I explained that this is not a common side effect but does occur in <2% of patients.   Luis Vincent has not had labs since December so we will repeat these today in anticipation of his upcoming appointment with Dr. Tommy Medal. Hopefully his viral load is now undetectable if his adherence has been as good as he reports. If so, could consider changing from Triumeq to [place] at his next visit.   Recommendations: - Continue Triumeq daily at bedtime - Check HIV viral load and CD4 count today - Follow up appointment scheduled 7/10 with Dr. Tommy Medal - Consider changing Triumeq to [place] at next visit  Luis Vincent, PharmD, [facility] PGY-2 Infectious Diseases Pharmacy Resident Pager: [phone] 06/09/2017, 12:12 PM

## 2017-06-10 LAB — T-HELPER CELL (CD4) - (RCID CLINIC ONLY)
CD4 % Helper T Cell: 33 % (ref 33–55)
CD4 T Cell Abs: 860 /uL (ref 400–2700)

## 2017-06-11 LAB — RPR

## 2017-06-12 ENCOUNTER — Ambulatory Visit: Payer: Self-pay

## 2017-06-12 LAB — HIV-1 RNA,QN PCR W/REFLEX GENOTYPE
HIV-1 RNA, QN PCR: <1.3 Log cps/mL — ABNORMAL HIGH
HIV-1 RNA, QN PCR: <20 Copies/mL — ABNORMAL HIGH

## 2017-06-17 ENCOUNTER — Ambulatory Visit: Payer: Self-pay | Admitting: Infectious Disease

## 2017-06-17 ENCOUNTER — Ambulatory Visit: Payer: Self-pay

## 2017-08-11 ENCOUNTER — Emergency Department (HOSPITAL_COMMUNITY)
Admission: EM | Admit: 2017-08-11 | Discharge: 2017-08-11 | Disposition: A | Discharge status: Home / Self Care | Payer: Self-pay | Attending: Emergency Medicine | Admitting: Emergency Medicine

## 2017-08-11 ENCOUNTER — Encounter (HOSPITAL_COMMUNITY): Payer: Self-pay | Admitting: *Deleted

## 2017-08-11 DIAGNOSIS — Z79899 Other long term (current) drug therapy: Secondary | ICD-10-CM | POA: Insufficient documentation

## 2017-08-11 DIAGNOSIS — Z0000 Encounter for general adult medical examination without abnormal findings: Secondary | ICD-10-CM | POA: Insufficient documentation

## 2017-08-11 NOTE — Discharge Instructions (Signed)
Please schedule an appointment with your primary care provider to discuss sleep concerns.  Return to the ER for new or concerning symptoms.

## 2017-08-11 NOTE — ED Notes (Signed)
Patient Alert and oriented X4. Stable and ambulatory. Patient verbalized understanding of the discharge instructions.  Patient belongings were taken by the patient.  

## 2017-08-11 NOTE — ED Provider Notes (Signed)
[facility] DEPT Provider Note   CSN: [id number] Arrival date & time: 08/11/17  2002     History   Chief Complaint Chief Complaint  Patient presents with  . Shortness of Breath    HPI Luis Vincent is a 31 y.o. male with past medical history of HIV and secondary syphilis, presenting to the ED for breathing concern. Patient states he has been told by his mother and his friends that he breathes loudly, with sounds like he is having difficulty breathing. He also reports his mother tells him he breathes loudly at night, and was told to come for evaluation. Patient denies difficulty breathing, shortness of breath, cough, chest pain, history of asthma. Per chart review, CD4 count 860 two months ago.   The history is provided by the patient.    Past Medical History:  Diagnosis Date  . Headache   . HIV infection ([place])   . Syphilis     Patient Active Problem List   Diagnosis Date Noted  . Tinea corporis 03/29/2015  . HIV disease ([place]) 02/17/2014  . 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 No pertinent surgical history.     Home Medications    Prior to Admission medications   Medication Sig Start Date End Date Taking? Authorizing Provider  abacavir-dolutegravir-lamiVUDine (TRIUMEQ) 600-50-300 MG tablet Take 1 tablet by mouth daily. 03/19/17   Truman Hayward, MD    Family History History reviewed. No pertinent family history.  Social History Social History  Substance Use Topics  . Smoking status: Never Smoker  . Smokeless tobacco: Never Used  . Alcohol use No     Allergies   Patient has no known allergies.   Review of Systems Review of Systems  Constitutional: Negative for fever.  HENT: Negative for sore throat and trouble swallowing.   Respiratory: Negative for cough, choking, chest tightness, shortness of breath, wheezing and stridor.   Cardiovascular: Negative for chest pain.     Physical Exam Updated Vital Signs BP (!)  143/91   Pulse 67   Temp 98.9 F (37.2 C) (Oral)   Resp 16   SpO2 100%   Physical Exam  Constitutional: He appears well-developed and well-nourished. No distress.  No increased work of breathing, no accessory muscle usage.  HENT:  Head: Normocephalic and atraumatic.  Mouth/Throat: Oropharynx is clear and moist.  Eyes: Conjunctivae are normal.  Neck: Normal range of motion. Neck supple. No tracheal deviation present.  Cardiovascular: Normal rate, regular rhythm, normal heart sounds and intact distal pulses.   Pulmonary/Chest: Effort normal and breath sounds normal. No stridor. No respiratory distress. He has no decreased breath sounds. He has no wheezes. He has no rhonchi. He has no rales. He exhibits no tenderness and no deformity.  Psychiatric: He has a normal mood and affect. His behavior is normal.  Nursing note and vitals reviewed.    ED Treatments / Results  Labs (all labs ordered are listed, but only abnormal results are displayed) Labs Reviewed - No data to display  EKG  EKG Interpretation None       Radiology No results found.  Procedures Procedures (including critical care time)  Medications Ordered in ED Medications - No data to display   Initial Impression / Assessment and Plan / ED Course  I have reviewed the triage vital signs and the nursing notes.  Pertinent labs & imaging results that were available during my care of the patient were reviewed by me and considered in my  medical decision making (see chart for details).     Patient presenting to the ED for family concern of breathing sounds. Patient without symptoms in ED, or prior to visit. On exam, lungs are clear bilaterally, normal work of breathing. o2 sat 100% on RA. It is unclear as to what his family was referring to, however on exam today patient without respiratory abnormalities. Patient is afebrile. CD4 count 860 two months ago. Patient is well-appearing, nondistressed, without any other  questions prior to discharge.  Discussed results, findings, treatment and follow up. Patient advised of return precautions. Patient verbalized understanding and agreed with plan.   Final Clinical Impressions(s) / ED Diagnoses   Final diagnoses:  Normal physical exam    New Prescriptions Discharge Medication List as of 08/11/2017  8:50 PM       Russo, [place] N, PA-C 08/11/17 2130    Pattricia Boss, MD 08/11/17 [phone]

## 2017-08-11 NOTE — ED Triage Notes (Signed)
Pt reports that his mother and friend told him today that he sleeps very hard and sounds like he has difficulty breathing. Pt denies sob at present, denies chest pain

## 2017-08-18 ENCOUNTER — Ambulatory Visit: Payer: Self-pay

## 2017-10-04 ENCOUNTER — Emergency Department (HOSPITAL_COMMUNITY)
Admission: EM | Admit: 2017-10-04 | Discharge: 2017-10-04 | Disposition: A | Discharge status: Home / Self Care | Payer: Self-pay | Attending: Emergency Medicine | Admitting: Emergency Medicine

## 2017-10-04 DIAGNOSIS — R360 Urethral discharge without blood: Secondary | ICD-10-CM

## 2017-10-04 DIAGNOSIS — R369 Urethral discharge, unspecified: Secondary | ICD-10-CM | POA: Insufficient documentation

## 2017-10-04 DIAGNOSIS — Z79899 Other long term (current) drug therapy: Secondary | ICD-10-CM | POA: Insufficient documentation

## 2017-10-04 DIAGNOSIS — R300 Dysuria: Secondary | ICD-10-CM | POA: Insufficient documentation

## 2017-10-04 LAB — URINALYSIS, ROUTINE W REFLEX MICROSCOPIC
BILIRUBIN URINE: NEGATIVE
Glucose, UA: NEGATIVE mg/dL
Hgb urine dipstick: NEGATIVE
KETONES UR: NEGATIVE mg/dL
Nitrite: NEGATIVE
Protein, ur: 30 mg/dL — AB
Specific Gravity, Urine: 1.023 (ref 1.005–1.030)
pH: 7 (ref 5.0–8.0)

## 2017-10-04 MED ORDER — CEFTRIAXONE SODIUM 250 MG IJ SOLR
250.0000 mg | Freq: Once | INTRAMUSCULAR | Status: AC
  Administered 2017-10-04: 250 mg via INTRAMUSCULAR
  Filled 2017-10-04: qty 250

## 2017-10-04 MED ORDER — AZITHROMYCIN 250 MG PO TABS
1000.0000 mg | ORAL_TABLET | Freq: Once | ORAL | Status: AC
  Administered 2017-10-04: 1000 mg via ORAL
  Filled 2017-10-04: qty 4

## 2017-10-04 MED ORDER — LIDOCAINE HCL (PF) 1 % IJ SOLN
INTRAMUSCULAR | Status: AC
  Filled 2017-10-04: qty 5

## 2017-10-04 NOTE — ED Provider Notes (Signed)
[facility] EMERGENCY DEPARTMENT Provider Note   CSN: [id number] Arrival date & time: 10/04/17  1644     History   Chief Complaint No chief complaint on file.   HPI Luis Vincent is a 31 y.o. male with history of HIV and syphilis who presents today with chief complaint acute onset, constant yellow penile discharge since yesterday. Associated symptoms include dysuria and urinary frequency. He denies abdominal pain, fevers, chills, CP, SOB, scrotal swelling, erythema, penile pain, or genital lesions. He states that he is currently sexually active with one male partner and that his partner is not complaining of any symptoms. He has not tried anything for his symptoms. He declines HIV or syphilis testing at this time.  The history is provided by the patient.    Past Medical History:  Diagnosis Date  . Headache   . HIV infection ([place])   . Syphilis     Patient Active Problem List   Diagnosis Date Noted  . Tinea corporis 03/29/2015  . HIV disease ([place]) 02/17/2014  . 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     Home Medications    Prior to Admission medications   Medication Sig Start Date End Date Taking? Authorizing Provider  abacavir-dolutegravir-lamiVUDine (TRIUMEQ) 600-50-300 MG tablet Take 1 tablet by mouth daily. 03/19/17   Truman Hayward, MD    Family History No family history on file.  Social History Social History  Substance Use Topics  . Smoking status: Never Smoker  . Smokeless tobacco: Never Used  . Alcohol use No     Allergies   Patient has no known allergies.   Review of Systems Review of Systems  Constitutional: Negative for chills and fever.  Respiratory: Negative for shortness of breath.   Cardiovascular: Negative for chest pain.  Gastrointestinal: Negative for abdominal pain, nausea and vomiting.  Genitourinary: Positive for discharge, dysuria and frequency. Negative for decreased  urine volume, hematuria, penile pain, penile swelling, scrotal swelling, testicular pain and urgency.  Musculoskeletal: Negative for back pain.  All other systems reviewed and are negative.    Physical Exam Updated Vital Signs BP 140/83 (BP Location: Right Arm)   Pulse 89   Temp 99 F (37.2 C) (Oral)   Resp 14   SpO2 99%   Physical Exam  Constitutional: He appears well-developed and well-nourished. No distress.  HENT:  Head: Normocephalic and atraumatic.  Eyes: Conjunctivae are normal. Right eye exhibits no discharge. Left eye exhibits no discharge.  Neck: No JVD present. No tracheal deviation present.  Cardiovascular: Normal rate, regular rhythm and normal heart sounds.   Pulmonary/Chest: Effort normal and breath sounds normal.  Abdominal: Soft. Bowel sounds are normal. He exhibits no distension. There is no tenderness.  No suprapubic or CVA tenderness  Genitourinary:  Genitourinary Comments: Examination performed in the presence of a chaperone. No inguinal lymphadenopathy. No swelling, erythema, or tenderness to palpation of the scrotum, testes, epididymis, or penis. No genital lesions noted. There is yellow-white drainage from the glans of the penis.  Musculoskeletal: He exhibits no edema.  Neurological: He is alert.  Skin: Skin is warm and dry. No erythema.  Psychiatric: He has a normal mood and affect. His behavior is normal.  Nursing note and vitals reviewed.    ED Treatments / Results  Labs (all labs ordered are listed, but only abnormal results are displayed) Labs Reviewed  URINALYSIS, ROUTINE W REFLEX MICROSCOPIC - Abnormal; Notable for the following:  Result Value   APPearance CLOUDY (*)    Protein, ur 30 (*)    Leukocytes, UA LARGE (*)    Bacteria, UA RARE (*)    Squamous Epithelial / LPF 0-5 (*)    All other components within normal limits  URINE CULTURE  GC/CHLAMYDIA PROBE AMP ([place]) NOT AT [facility]    EKG  EKG Interpretation None        Radiology No results found.  Procedures Procedures (including critical care time)  Medications Ordered in ED Medications  cefTRIAXone (ROCEPHIN) injection 250 mg (not administered)  azithromycin (ZITHROMAX) tablet 1,000 mg (not administered)     Initial Impression / Assessment and Plan / ED Course  I have reviewed the triage vital signs and the nursing notes.  Pertinent labs & imaging results that were available during my care of the patient were reviewed by me and considered in my medical decision making (see chart for details).     Patient is afebrile without abdominal tenderness, abdominal pain or painful bowel movements to indicate prostatitis.  No tenderness to palpation of the testes or epididymis to suggest orchitis or epididymitis.  STD cultures obtained gonorrhea and chlamydia. Patient is HIV-positive and declined syphilis testing. He has no syphilitic chancre. Patient to be discharged with instructions to follow up with PCP. Discussed importance of using protection when sexually active. Pt understands that they have GC/Chlamydia cultures pending and that they will need to inform all sexual partners if results return positive. Patient has been treated prophylactically with azithromycin and Rocephin. Discussed indications for return to the ED. Pt verbalized understanding of and agreement with plan and is safe for discharge home at this time.      Final Clinical Impressions(s) / ED Diagnoses   Final diagnoses:  Urethral discharge in male, without blood  Dysuria    New Prescriptions New Prescriptions   No medications on file     Debroah Baller 10/04/17 1854    Pattricia Boss, MD 10/07/17 1155

## 2017-10-04 NOTE — Discharge Instructions (Signed)
Follow up with [facility] STD clinic to be screened for HIV in the future and for future STD concerns or screenings. This is the recommendation by the CDC for people with multiple sexual partners or hx of STDs. You have been treated for gonorrhea and chlamydia in the ER but the hospital will call you if lab is positive.

## 2017-10-04 NOTE — ED Triage Notes (Signed)
Pt in c/o yellow penile discharge and dysuria onset today, denies other symptoms, A&O x4

## 2017-10-06 LAB — URINE CULTURE: Culture: <10000 — AB

## 2017-10-06 LAB — GC/CHLAMYDIA PROBE AMP (~~LOC~~) NOT AT ARMC
Chlamydia: NEGATIVE
Neisseria Gonorrhea: POSITIVE — AB

## 2017-12-20 ENCOUNTER — Emergency Department (HOSPITAL_COMMUNITY)
Admission: EM | Admit: 2017-12-20 | Discharge: 2017-12-20 | Disposition: A | Discharge status: Home / Self Care | Payer: Self-pay | Attending: Emergency Medicine | Admitting: Emergency Medicine

## 2017-12-20 ENCOUNTER — Other Ambulatory Visit: Payer: Self-pay

## 2017-12-20 ENCOUNTER — Encounter (HOSPITAL_COMMUNITY): Payer: Self-pay

## 2017-12-20 DIAGNOSIS — N342 Other urethritis: Secondary | ICD-10-CM

## 2017-12-20 DIAGNOSIS — N341 Nonspecific urethritis: Secondary | ICD-10-CM | POA: Insufficient documentation

## 2017-12-20 DIAGNOSIS — R369 Urethral discharge, unspecified: Secondary | ICD-10-CM

## 2017-12-20 DIAGNOSIS — Z21 Asymptomatic human immunodeficiency virus [HIV] infection status: Secondary | ICD-10-CM | POA: Insufficient documentation

## 2017-12-20 HISTORY — DX: Immunodeficiency, unspecified: D84.9

## 2017-12-20 LAB — URINALYSIS, MICROSCOPIC (REFLEX)

## 2017-12-20 LAB — URINALYSIS, ROUTINE W REFLEX MICROSCOPIC
LEUKOCYTES UA: NEGATIVE
Nitrite: NEGATIVE

## 2017-12-20 MED ORDER — STERILE WATER FOR INJECTION IJ SOLN
INTRAMUSCULAR | Status: AC
  Administered 2017-12-20: 2.1 mL
  Filled 2017-12-20: qty 10

## 2017-12-20 MED ORDER — CEFTRIAXONE SODIUM 250 MG IJ SOLR
250.0000 mg | Freq: Once | INTRAMUSCULAR | Status: AC
  Administered 2017-12-20: 250 mg via INTRAMUSCULAR
  Filled 2017-12-20: qty 250

## 2017-12-20 MED ORDER — METRONIDAZOLE 500 MG PO TABS
2000.0000 mg | ORAL_TABLET | Freq: Once | ORAL | Status: AC
  Administered 2017-12-20: 2000 mg via ORAL
  Filled 2017-12-20: qty 4

## 2017-12-20 MED ORDER — AZITHROMYCIN 250 MG PO TABS
1000.0000 mg | ORAL_TABLET | Freq: Once | ORAL | Status: AC
  Administered 2017-12-20: 1000 mg via ORAL
  Filled 2017-12-20: qty 4

## 2017-12-20 NOTE — ED Provider Notes (Signed)
[facility] EMERGENCY DEPARTMENT Provider Note   CSN: [id number] Arrival date & time: 12/20/17  1810     History   Chief Complaint Chief Complaint  Patient presents with  . Dysuria  . Hematuria    HPI Luis Vincent is a 32 y.o. male.  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  Patient also reports that he is having penile discharge.  Patient admits to having unprotected intercourse 2 weeks ago.  Patient denies any nausea, vomiting, fevers, chills, back pain.  Past Medical History:  Diagnosis Date  . Headache   . HIV infection ([place])   . Immune deficiency disorder ([place])   . Syphilis     Patient Active Problem List   Diagnosis Date Noted  . Tinea corporis 03/29/2015  . HIV disease ([place]) 02/17/2014  . 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 No pertinent surgical history.     Home Medications    Prior to Admission medications   Medication Sig Start Date End Date Taking? Authorizing Provider  abacavir-dolutegravir-lamiVUDine (TRIUMEQ) 600-50-300 MG tablet Take 1 tablet by mouth daily. 03/19/17  Yes Tommy Medal, Lavell Islam, MD    Family History History reviewed. No pertinent family history.  Social History Social History   Tobacco Use  . Smoking status: Never Smoker  . Smokeless tobacco: Never Used  Substance Use Topics  . Alcohol use: No  . Drug use: No     Allergies   Patient has no known allergies.   Review of Systems Review of Systems  Constitutional: Negative for fever.  Genitourinary: Positive for dysuria.  Skin: Negative for rash.  Allergic/Immunologic: Positive for immunocompromised state.  All other systems reviewed and are negative.    Physical Exam Updated Vital Signs BP 131/79 (BP Location: Right Arm)   Pulse 71   Temp 98.9 F (37.2 C) (Oral)   Resp 18   SpO2 100%   Physical Exam  Constitutional: He  is oriented to person, place, and time. He appears well-developed.  HENT:  Head: Atraumatic.  Neck: Neck supple.  Cardiovascular: Normal rate.  Pulmonary/Chest: Effort normal.  Abdominal: Soft.  Genitourinary: Prostate normal and penis normal.  Genitourinary Comments: Pt has purulent discharge from the penis  Neurological: He is alert and oriented to person, place, and time.  Skin: Skin is warm.  Nursing note and vitals reviewed.    ED Treatments / Results  Labs (all labs ordered are listed, but only abnormal results are displayed) Labs Reviewed  URINALYSIS, ROUTINE W REFLEX MICROSCOPIC - Abnormal; Notable for the following components:      Result Value   Color, Urine RED (*)    APPearance TURBID (*)    Glucose, UA   (*)    Value: TEST NOT REPORTED DUE TO COLOR INTERFERENCE OF URINE PIGMENT   Hgb urine dipstick   (*)    Value: TEST NOT REPORTED DUE TO COLOR INTERFERENCE OF URINE PIGMENT   Bilirubin Urine   (*)    Value: TEST NOT REPORTED DUE TO COLOR INTERFERENCE OF URINE PIGMENT   Ketones, ur   (*)    Value: TEST NOT REPORTED DUE TO COLOR INTERFERENCE OF URINE PIGMENT   Protein, ur   (*)    Value: TEST NOT REPORTED DUE TO COLOR INTERFERENCE OF URINE PIGMENT   All other components within normal limits  URINALYSIS, MICROSCOPIC (REFLEX) - Abnormal; Notable for the  following components:   Bacteria, UA PRESENT (*)    Squamous Epithelial / LPF 0-5 (*)    All other components within normal limits  RPR  GC/CHLAMYDIA PROBE AMP ([place]) NOT AT [facility]    EKG  EKG Interpretation None       Radiology No results found.  Procedures Procedures (including critical care time)  Medications Ordered in ED Medications  metroNIDAZOLE (FLAGYL) tablet 2,000 mg (2,000 mg Oral Given 12/20/17 2118)  azithromycin (ZITHROMAX) tablet 1,000 mg (1,000 mg Oral Given 12/20/17 2117)  cefTRIAXone (ROCEPHIN) injection 250 mg (250 mg Intramuscular Given 12/20/17 2118)  sterile water  (preservative free) injection (2.1 mLs  Given 12/20/17 2119)     Initial Impression / Assessment and Plan / ED Course  I have reviewed the triage vital signs and the nursing notes.  Pertinent labs & imaging results that were available during my care of the patient were reviewed by me and considered in my medical decision making (see chart for details).    32 year old male with history of HIV AIDS and syphilis comes in with chief complaint of dysuria, hematuria.  Patient on review of system also admits to penile discharge.  No rash associated with it.  Exam consistent with STDs.  We will cover patient with GC, chlamydia in the ED.  Patient has been advised to use protection with intercourse especially given his known history of HIV and syphilis, to protect himself and his partners.  Final Clinical Impressions(s) / ED Diagnoses   Final diagnoses:  Penile discharge  Urethritis    ED Discharge Orders    None       Varney Biles, MD 12/20/17 2349

## 2017-12-20 NOTE — ED Triage Notes (Signed)
Pt reports he has had non productive cough x several days.  No respiratory or swallowing difficulties.

## 2017-12-20 NOTE — ED Notes (Signed)
ED Provider at bedside. 

## 2017-12-20 NOTE — Discharge Instructions (Signed)
We suspect that your symptoms are likely from STDs.  We have given you antibiotics to cover you for gonorrhea, chlamydia and trichomoniasis.   Please practice safe intercourse, which is absolutely prudent given your history of HIV.

## 2017-12-20 NOTE — ED Triage Notes (Signed)
Onset  Several days dysuria.  Onset today blood noted in urine.  No fever, back pain.

## 2017-12-21 LAB — RPR: RPR: NONREACTIVE

## 2017-12-22 LAB — CYTOLOGY, (ORAL, ANAL, URETHRAL) ANCILLARY ONLY
Chlamydia: NEGATIVE
Neisseria Gonorrhea: POSITIVE — AB

## 2018-01-12 ENCOUNTER — Ambulatory Visit: Payer: Self-pay

## 2018-09-14 ENCOUNTER — Emergency Department (HOSPITAL_COMMUNITY): Payer: Self-pay

## 2018-09-14 ENCOUNTER — Other Ambulatory Visit: Payer: Self-pay

## 2018-09-14 ENCOUNTER — Emergency Department (HOSPITAL_COMMUNITY)
Admission: EM | Admit: 2018-09-14 | Discharge: 2018-09-14 | Disposition: A | Discharge status: Home / Self Care | Payer: Self-pay | Attending: Emergency Medicine | Admitting: Emergency Medicine

## 2018-09-14 DIAGNOSIS — Z79899 Other long term (current) drug therapy: Secondary | ICD-10-CM | POA: Insufficient documentation

## 2018-09-14 DIAGNOSIS — R2243 Localized swelling, mass and lump, lower limb, bilateral: Secondary | ICD-10-CM | POA: Insufficient documentation

## 2018-09-14 DIAGNOSIS — M7989 Other specified soft tissue disorders: Secondary | ICD-10-CM

## 2018-09-14 DIAGNOSIS — R609 Edema, unspecified: Secondary | ICD-10-CM

## 2018-09-14 LAB — CBC WITH DIFFERENTIAL/PLATELET
Abs Immature Granulocytes: 0 10*3/uL (ref 0.0–0.1)
BASOS ABS: 0.1 10*3/uL (ref 0.0–0.1)
BASOS PCT: 1 %
Eosinophils Absolute: 0.2 10*3/uL (ref 0.0–0.7)
Eosinophils Relative: 4 %
HCT: 43.8 % (ref 39.0–52.0)
Hemoglobin: 14.6 g/dL (ref 13.0–17.0)
Immature Granulocytes: 0 %
Lymphocytes Relative: 42 %
Lymphs Abs: 2 10*3/uL (ref 0.7–4.0)
MCH: 29.4 pg (ref 26.0–34.0)
MCHC: 33.3 g/dL (ref 30.0–36.0)
MCV: 88.1 fL (ref 78.0–100.0)
MONO ABS: 0.6 10*3/uL (ref 0.1–1.0)
MONOS PCT: 12 %
NEUTROS ABS: 1.9 10*3/uL (ref 1.7–7.7)
NEUTROS PCT: 41 %
PLATELETS: 241 10*3/uL (ref 150–400)
RBC: 4.97 MIL/uL (ref 4.22–5.81)
RDW: 12.3 % (ref 11.5–15.5)
WBC: 4.7 10*3/uL (ref 4.0–10.5)

## 2018-09-14 LAB — COMPREHENSIVE METABOLIC PANEL
ALK PHOS: 55 U/L (ref 38–126)
ALT: 36 U/L (ref 0–44)
ANION GAP: 6 (ref 5–15)
AST: 27 U/L (ref 15–41)
Albumin: 3.6 g/dL (ref 3.5–5.0)
BILIRUBIN TOTAL: 0.9 mg/dL (ref 0.3–1.2)
BUN: 13 mg/dL (ref 6–20)
CHLORIDE: 107 mmol/L (ref 98–111)
CO2: 25 mmol/L (ref 22–32)
Calcium: 9.3 mg/dL (ref 8.9–10.3)
Creatinine, Ser: 1.48 mg/dL — ABNORMAL HIGH (ref 0.61–1.24)
GFR calc Af Amer: >60 mL/min (ref >60)
GFR calc non Af Amer: >60 mL/min (ref >60)
GLUCOSE: 102 mg/dL — AB (ref 70–99)
POTASSIUM: 4.3 mmol/L (ref 3.5–5.1)
SODIUM: 138 mmol/L (ref 135–145)
Total Protein: 6.7 g/dL (ref 6.5–8.1)

## 2018-09-14 LAB — BRAIN NATRIURETIC PEPTIDE: B NATRIURETIC PEPTIDE 5: 6.2 pg/mL (ref 0.0–100.0)

## 2018-09-14 NOTE — ED Notes (Signed)
Patient transported to X-ray 

## 2018-09-14 NOTE — ED Triage Notes (Signed)
Pt reports intermittent bilateral ankle swelling x 1 week. Pt denies pain and any other complaints

## 2018-09-14 NOTE — ED Notes (Signed)
Patient is A&Ox4.  No signs of distress noted.  Please see providers complete history and physical exam.  

## 2018-09-14 NOTE — Discharge Instructions (Addendum)
You were evaluated in the Emergency Department and after careful evaluation, we did not find any emergent condition requiring admission or further testing in the hospital.  Your symptoms today seem to be due to venous insufficiency.  Your kidney function was evaluated today and found to be slightly worse than your prior measurements.  Your creatinine today was 1.48.  This is something that should be followed up by your primary care provider.  Please return to the Emergency Department if you experience any worsening of your condition.  We encourage you to follow up with a primary care provider.  Thank you for allowing [place] to be a part of your care.

## 2018-09-14 NOTE — ED Provider Notes (Signed)
  Provider Note MRN:  [id number]  Arrival date & time: 09/14/18    ED Course and Medical Decision Making  I received sign out for this patient at shift change from Dr. Tyrone Nine.  Work-up largely unremarkable.  Creatinine slightly increased from prior, at 1.48.  Unlikely to be the major contributor to this patient's leg edema.  Still the patient was notified of this kidney function, will follow up with his primary care provider for further management.  After the discussed management above, the patient was determined to be safe for discharge.  The patient was in agreement with this plan and all questions regarding their care were answered.  ED return precautions were discussed and the patient will return to the ED with any significant worsening of condition.  Barth Kirks. Sedonia Small, [place] [email or web address]     Maudie Flakes, MD 09/14/18 [phone]

## 2018-09-14 NOTE — ED Notes (Addendum)
PT states understanding of care given, follow up care. PT ambulated from ED to car with a steady gait.  

## 2018-09-14 NOTE — ED Provider Notes (Signed)
[facility] EMERGENCY DEPARTMENT Provider Note   CSN: [id number] Arrival date & time: 09/14/18  2019     History   Chief Complaint Chief Complaint  Patient presents with  . Joint Swelling    HPI Luis Vincent is a 32 y.o. male.  32 yo M with a chief complaint of bilateral lower extremity edema.  This been going on for the past couple weeks usually seems to get better when he elevates his legs.  Denies any change of medications denies any new job or exercise.  Denies any change in diet.  Denies any chest pain or shortness of breath.  Denies orthopnea or PND.  The history is provided by the patient.  Illness  This is a new problem. The current episode started more than 1 week ago. The problem occurs constantly. The problem has been gradually worsening. Pertinent negatives include no chest pain, no abdominal pain, no headaches and no shortness of breath. Nothing aggravates the symptoms. Nothing relieves the symptoms. He has tried nothing for the symptoms. The treatment provided no relief.    Past Medical History:  Diagnosis Date  . Headache   . HIV infection ([place])   . Immune deficiency disorder ([place])   . Syphilis     Patient Active Problem List   Diagnosis Date Noted  . Tinea corporis 03/29/2015  . HIV disease ([place]) 02/17/2014  . 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      Home Medications    Prior to Admission medications   Medication Sig Start Date End Date Taking? Authorizing Provider  abacavir-dolutegravir-lamiVUDine (TRIUMEQ) 600-50-300 MG tablet Take 1 tablet by mouth daily. 03/19/17   Truman Hayward, MD    Family History No family history on file.  Social History Social History   Tobacco Use  . Smoking status: Never Smoker  . Smokeless tobacco: Never Used  Substance Use Topics  . Alcohol use: No  . Drug use: No     Allergies   Patient has no known allergies.   Review of  Systems Review of Systems  Constitutional: Negative for chills and fever.  HENT: Negative for congestion and facial swelling.   Eyes: Negative for discharge and visual disturbance.  Respiratory: Negative for shortness of breath.   Cardiovascular: Positive for leg swelling. Negative for chest pain and palpitations.  Gastrointestinal: Negative for abdominal pain, diarrhea and vomiting.  Musculoskeletal: Negative for arthralgias and myalgias.  Skin: Negative for color change and rash.  Neurological: Negative for tremors, syncope and headaches.  Psychiatric/Behavioral: Negative for confusion and dysphoric mood.     Physical Exam Updated Vital Signs BP (!) 149/91 (BP Location: Right Arm)   Pulse (!) 57   Temp 98.3 F (36.8 C) (Oral)   Resp 16   SpO2 100%   Physical Exam  Constitutional: He is oriented to person, place, and time. He appears well-developed and well-nourished.  HENT:  Head: Normocephalic and atraumatic.  Eyes: Pupils are equal, round, and reactive to light. EOM are normal.  Neck: Normal range of motion. Neck supple. No JVD present.  Cardiovascular: Normal rate and regular rhythm. Exam reveals no gallop and no friction rub.  No murmur heard. Pulmonary/Chest: No respiratory distress. He has no wheezes.  Abdominal: He exhibits no distension. There is no rebound and no guarding.  Musculoskeletal: Normal range of motion. He exhibits edema (2+ to BLE to lower third of shin down).  Neurological: He is alert and  oriented to person, place, and time.  Skin: No rash noted. No pallor.  Psychiatric: He has a normal mood and affect. His behavior is normal.  Nursing note and vitals reviewed.    ED Treatments / Results  Labs (all labs ordered are listed, but only abnormal results are displayed) Labs Reviewed  COMPREHENSIVE METABOLIC PANEL - Abnormal; Notable for the following components:      Result Value   Glucose, Bld 102 (*)    Creatinine, Ser 1.48 (*)    All other  components within normal limits  BRAIN NATRIURETIC PEPTIDE  CBC WITH DIFFERENTIAL/PLATELET    EKG None  Radiology Dg Chest 2 View  Result Date: 09/14/2018 CLINICAL DATA:  Intermittent bilateral ankle swelling for 1 week. EXAM: CHEST - 2 VIEW COMPARISON:  None. FINDINGS: The heart size and mediastinal contours are within normal limits. Both lungs are clear. The visualized skeletal structures are unremarkable. IMPRESSION: No active cardiopulmonary disease. Electronically Signed   By: Lucienne Capers M.D.   On: 09/14/2018 21:52    Procedures Procedures (including critical care time)  Medications Ordered in ED Medications - No data to display   Initial Impression / Assessment and Plan / ED Course  I have reviewed the triage vital signs and the nursing notes.  Pertinent labs & imaging results that were available during my care of the patient were reviewed by me and considered in my medical decision making (see chart for details).     32 yo M with a chief complaint of bilateral lower extremity edema.  I discussed with him a evaluation to see what his renal function and liver function test and BMP were.  Patient would like to have that performed.  Turned over to Dr. Sedonia Small, plan for d/c home when all lab work is likely unremarkable with PCP follow up.   3:09 PM:  I have discussed the diagnosis/risks/treatment options with the patient and believe the pt to be eligible for discharge home to follow-up with PCP. We also discussed returning to the ED immediately if new or worsening sx occur. We discussed the sx which are most concerning (e.g., sudden worsening pain, fever, inability to tolerate by mouth) that necessitate immediate return. Medications administered to the patient during their visit and any new prescriptions provided to the patient are listed below.  Medications given during this visit Medications - No data to display    The patient appears reasonably screen and/or stabilized  for discharge and I doubt any other medical condition or other [facility] requiring further screening, evaluation, or treatment in the ED at this time prior to discharge.    Final Clinical Impressions(s) / ED Diagnoses   Final diagnoses:  Dependent edema  Leg swelling    ED Discharge Orders    None       Deno Etienne, DO 09/15/18 1509

## 2018-10-08 ENCOUNTER — Ambulatory Visit: Payer: Self-pay

## 2018-10-20 ENCOUNTER — Emergency Department (HOSPITAL_COMMUNITY)
Admission: EM | Admit: 2018-10-20 | Discharge: 2018-10-20 | Disposition: A | Discharge status: Home / Self Care | Payer: Self-pay | Attending: Emergency Medicine | Admitting: Emergency Medicine

## 2018-10-20 ENCOUNTER — Encounter (HOSPITAL_COMMUNITY): Payer: Self-pay

## 2018-10-20 ENCOUNTER — Other Ambulatory Visit: Payer: Self-pay

## 2018-10-20 DIAGNOSIS — Z79899 Other long term (current) drug therapy: Secondary | ICD-10-CM | POA: Insufficient documentation

## 2018-10-20 DIAGNOSIS — R369 Urethral discharge, unspecified: Secondary | ICD-10-CM | POA: Insufficient documentation

## 2018-10-20 LAB — URINALYSIS, ROUTINE W REFLEX MICROSCOPIC
BILIRUBIN URINE: NEGATIVE
GLUCOSE, UA: NEGATIVE mg/dL
Hgb urine dipstick: NEGATIVE
KETONES UR: NEGATIVE mg/dL
NITRITE: NEGATIVE
PH: 5 (ref 5.0–8.0)
Protein, ur: 30 mg/dL — AB
Specific Gravity, Urine: 1.027 (ref 1.005–1.030)
WBC, UA: >50 WBC/hpf — ABNORMAL HIGH (ref 0–5)

## 2018-10-20 MED ORDER — AZITHROMYCIN 250 MG PO TABS
1000.0000 mg | ORAL_TABLET | Freq: Once | ORAL | Status: AC
  Administered 2018-10-20: 1000 mg via ORAL
  Filled 2018-10-20: qty 4

## 2018-10-20 MED ORDER — LIDOCAINE HCL (PF) 1 % IJ SOLN
INTRAMUSCULAR | Status: AC
  Administered 2018-10-20: 1.5 mL
  Filled 2018-10-20: qty 5

## 2018-10-20 MED ORDER — CEFTRIAXONE SODIUM 250 MG IJ SOLR
250.0000 mg | Freq: Once | INTRAMUSCULAR | Status: AC
Start: 2018-10-20 — End: 2018-10-20
  Administered 2018-10-20: 250 mg via INTRAMUSCULAR
  Filled 2018-10-20: qty 250

## 2018-10-20 NOTE — ED Notes (Signed)
ED Provider at bedside. 

## 2018-10-20 NOTE — ED Notes (Signed)
Patient Alert and oriented to baseline. Stable and ambulatory to baseline. Patient verbalized understanding of the discharge instructions.  Patient belongings were taken by the patient.   

## 2018-10-20 NOTE — ED Triage Notes (Signed)
Pt reports burning with urination since yesterday and yellow discharge. Pt endorses frequent urination.

## 2018-10-20 NOTE — Discharge Instructions (Signed)
You have been treated in the emergency department tonight for gonorrhea and chlamydia. These tests are still pending in the lab, and if they're positive, someone will call you.  Your urine culture is also pending.  If your test for gonorrhea or chlamydia is positive, it is important that you let all sexual partners know so they can seek testing and treatment.  Based on CDC guidelines, you should avoid all sexual activities for 1 week after treatment.  Keep in mind, if you are treated, but you participate in sexual activities with a partner that has not been treated you can get reinfected.  Keep your appointment with Dr. Tommy Medal at the infectious disease clinic.   If you develop any new or worsening symptoms including fever, chills, worsening abdominal or pelvic pain, please return to the emergency department for re-evaluation.

## 2018-10-20 NOTE — ED Provider Notes (Signed)
[facility] EMERGENCY DEPARTMENT Provider Note   CSN: [id number] Arrival date & time: 10/20/18  2229     History   Chief Complaint Chief Complaint  Patient presents with  . Dysuria  . Penile Discharge    HPI Luis Vincent is a 32 y.o. male with a h/o of AIDs and Syphilis who presents to the emergency department with a chief complaint of penile discharge with associated dysuria and urinary frequency.  The patient endorses yellow penile discharge and dysuria, onset today and urinary frequency that began yesterday.  He denies fever, chills, abdominal pain, back pain, nausea, vomiting, hematuria, rectal pain, urinary hesitancy, rashes or lesions, penile or testicular pain or swelling, or perineal pain.  No treatment prior to arrival.  The patient reports he is sexually active with one male partner.  They do not use condoms.  Per chart review, it appears that the patient seen by infectious disease since January 2018 when he was seeking treatment for secondary syphilis.  He denies being on any daily medications.   The history is provided by the patient. No language interpreter was used.    Past Medical History:  Diagnosis Date  . Headache   . HIV infection ([place])   . Immune deficiency disorder ([place])   . Syphilis     Patient Active Problem List   Diagnosis Date Noted  . Tinea corporis 03/29/2015  . HIV disease ([place]) 02/17/2014  . 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 No pertinent surgical history.      Home Medications    Prior to Admission medications   Medication Sig Start Date End Date Taking? Authorizing Provider  abacavir-dolutegravir-lamiVUDine (TRIUMEQ) 600-50-300 MG tablet Take 1 tablet by mouth daily. 03/19/17   Truman Hayward, MD    Family History No family history on file.  Social History Social History   Tobacco Use  . Smoking status: Never Smoker  . Smokeless tobacco: Never Used  Substance Use Topics    . Alcohol use: No  . Drug use: No     Allergies   Patient has no known allergies.   Review of Systems Review of Systems  Constitutional: Negative for appetite change, chills and fever.  Respiratory: Negative for shortness of breath.   Cardiovascular: Negative for chest pain.  Gastrointestinal: Negative for abdominal pain, constipation, diarrhea, nausea and vomiting.  Genitourinary: Positive for discharge, dysuria and frequency. Negative for genital sores, hematuria, penile pain, penile swelling, scrotal swelling, testicular pain and urgency.  Musculoskeletal: Negative for back pain, myalgias, neck pain and neck stiffness.  Skin: Negative for rash.  Allergic/Immunologic: Negative for immunocompromised state.  Neurological: Negative for weakness, numbness and headaches.  Psychiatric/Behavioral: Negative for confusion.   Physical Exam Updated Vital Signs BP (!) 172/112   Pulse 96   Temp 98.3 F (36.8 C) (Oral)   Resp 18   SpO2 99%   Physical Exam  Constitutional: He appears well-developed.  Obese male.   HENT:  Head: Normocephalic.  Eyes: Conjunctivae are normal.  Neck: Neck supple.  Cardiovascular: Normal rate and regular rhythm.  No murmur heard. Pulmonary/Chest: Effort normal.  Abdominal: Soft. Bowel sounds are normal. He exhibits no distension and no mass. There is no tenderness. There is no rebound and no guarding. No hernia.  Genitourinary:  Genitourinary Comments: Chaperoned exam.  Purulent discharge present at the urethral meatus.  No lesions, edema, or erythema noted to the penis or scrotum.  No inguinal lymphadenopathy bilaterally.  Neurological: He  is alert.  Skin: Skin is warm and dry.  Psychiatric: His behavior is normal.  Nursing note and vitals reviewed.    ED Treatments / Results  Labs (all labs ordered are listed, but only abnormal results are displayed) Labs Reviewed  URINALYSIS, ROUTINE W REFLEX MICROSCOPIC - Abnormal; Notable for the following  components:      Result Value   APPearance CLOUDY (*)    Protein, ur 30 (*)    Leukocytes, UA LARGE (*)    WBC, UA >50 (*)    Bacteria, UA RARE (*)    All other components within normal limits  URINE CULTURE  GC/CHLAMYDIA PROBE AMP ([place]) NOT AT [facility]    EKG None  Radiology No results found.  Procedures Procedures (including critical care time)  Medications Ordered in ED Medications  cefTRIAXone (ROCEPHIN) injection 250 mg (has no administration in time range)  azithromycin (ZITHROMAX) tablet 1,000 mg (has no administration in time range)  lidocaine (PF) (XYLOCAINE) 1 % injection (has no administration in time range)     Initial Impression / Assessment and Plan / ED Course  I have reviewed the triage vital signs and the nursing notes.  Pertinent labs & imaging results that were available during my care of the patient were reviewed by me and considered in my medical decision making (see chart for details).  32 year old male with a history of AIDS and syphilis presenting with penile discharge, dysuria, and urinary frequency. Patient is afebrile without abdominal tenderness, abdominal pain or painful bowel movements to indicate prostatitis or Fournier's gangrene.  No tenderness to palpation of the testes or epididymis to suggest orchitis or epididymitis.  STD cultures obtained including gonorrhea and chlamydia.  UA with pyuria and leukocyte esterase.  Urine culture has been sent.  Clinical Course as of Oct 20 2338  Tue Oct 20, 2018  2334 Patient recheck.  After reviewing the patient's medical record, inquired about the patient's HIV/AIDS status as he originally stated he was not taking any medications. He now states he had AIDs and is on Genvoya. States he has an appointment with Dr. Tommy Medal with ID tomorrow.  I do not see an active prescription for Genvoya in the patient's chart nor a pending appointment with Dr. Drucilla Schmidt.  We had a lengthy conversation regarding compliance  with home medications and close follow-up with ID.  All questions answered and the patient acknowledged and was agreeable to the plan.   [MM]    Clinical Course User Index [MM] Eathen Budreau A, PA-C    Patient to be discharged with instructions to follow up with infectious disease. Discussed importance of using protection when sexually active. Pt understands that they have GC/Chlamydia cultures pending and that they will need to inform all sexual partners if results return positive. Patient has been treated prophylactically with azithromycin and Rocephin.  Final Clinical Impressions(s) / ED Diagnoses   Final diagnoses:  Penile discharge    ED Discharge Orders    None       Joanne Gavel, PA-C 10/20/18 2340    Orlie Dakin, MD 10/21/18 [phone]

## 2018-10-21 LAB — GC/CHLAMYDIA PROBE AMP (~~LOC~~) NOT AT ARMC
Chlamydia: NEGATIVE
NEISSERIA GONORRHEA: POSITIVE — AB

## 2018-10-22 LAB — URINE CULTURE

## 2018-10-27 ENCOUNTER — Other Ambulatory Visit: Payer: Self-pay | Admitting: Behavioral Health

## 2018-10-27 ENCOUNTER — Ambulatory Visit: Payer: Self-pay

## 2018-10-27 ENCOUNTER — Other Ambulatory Visit: Payer: Self-pay

## 2018-10-27 DIAGNOSIS — Z113 Encounter for screening for infections with a predominantly sexual mode of transmission: Secondary | ICD-10-CM

## 2018-10-27 DIAGNOSIS — B20 Human immunodeficiency virus [HIV] disease: Secondary | ICD-10-CM

## 2018-10-27 DIAGNOSIS — Z79899 Other long term (current) drug therapy: Secondary | ICD-10-CM

## 2018-11-23 NOTE — Progress Notes (Deleted)
Name: Luis Vincent  DOB: Sep 28, 1986 MRN: [id number] PCP: System, Pcp Not In    Patient Active Problem List   Diagnosis Date Noted  . Tinea corporis 03/29/2015  . HIV disease ([place]) 02/17/2014  . 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 male with HIV infection w/o AIDS dx; diagnosed ***.  History of OIs: ***  Previous Regimens: . Stribild . Genvoya (poor adherence) . Triumeq   Genotypes: . NNRTI mutation per records   Subjective:  CC:  Returning to care for HIV/AIDS.   HPI:  Last seen by Dr. Tommy Medal nearly 2 years ago. He at the time was switched to Triumeq d/t poor adherence records and ADAP lapses. He has not had any follow up since. Has been in the ER several times with various complaints, mostly STI symptoms. Dx with gonorrhea several times this year with last treatment in ER on 10/2018 visit. History of syphilis as well with RPR non-reactive 23m ago.   HIV 1 RNA Quant (copies/mL)  Date Value  11/26/2016 10,957 (H)  02/05/2016 25,883 (H)  03/29/2015 23 (H)   CD4 T Cell Abs (/uL)  Date Value  06/09/2017 860  11/26/2016 850  02/05/2016 980     Depression screen PHQ 2/9 03/29/2015  Decreased Interest 0  Down, Depressed, Hopeless 0  PHQ - 2 Score 0    Health Maintenance = Receives annual preventative care through {his/her} PCP and is up to date on all recommended screenings and vaccinations. {He/She} denies cigarette use, excessive alcohol use and other substance abuse. {He/She} is seeing a dentist regularly and no dental complaints today.   ROS  Past Medical History:  Diagnosis Date  . Headache   . HIV infection ([place])   . Immune deficiency disorder ([place])   . Syphilis     Outpatient Medications Prior to Visit  Medication Sig Dispense Refill  . abacavir-dolutegravir-lamiVUDine (TRIUMEQ) 600-50-300 MG tablet Take 1 tablet by mouth daily. 30 tablet 2   No facility-administered medications prior to  visit.      No Known Allergies  Social History   Tobacco Use  . Smoking status: Never Smoker  . Smokeless tobacco: Never Used  Substance Use Topics  . Alcohol use: No  . Drug use: No    No family history on file.  Social History   Substance and Sexual Activity  Sexual Activity Not Currently     Objective:  There were no vitals filed for this visit. There is no height or weight on file to calculate BMI.  Physical Exam  Lab Results Lab Results  Component Value Date   WBC 4.7 09/14/2018   HGB 14.6 09/14/2018   HCT 43.8 09/14/2018   MCV 88.1 09/14/2018   PLT 241 09/14/2018    Lab Results  Component Value Date   CREATININE 1.48 (H) 09/14/2018   BUN 13 09/14/2018   NA 138 09/14/2018   K 4.3 09/14/2018   CL 107 09/14/2018   CO2 25 09/14/2018    Lab Results  Component Value Date   ALT 36 09/14/2018   AST 27 09/14/2018   ALKPHOS 55 09/14/2018   BILITOT 0.9 09/14/2018    Lab Results  Component Value Date   CHOL 220 (H) 02/05/2016   HDL 52 02/05/2016   LDLCALC 156 (H) 02/05/2016   TRIG 58 02/05/2016   CHOLHDL 4.2 02/05/2016   HIV 1 RNA Quant (copies/mL)  Date Value  11/26/2016 10,957 (H)  02/05/2016 25,883 (H)  03/29/2015 23 (H)   CD4 T Cell Abs (/uL)  Date Value  06/09/2017 860  11/26/2016 850  02/05/2016 980     Assessment & Plan:   Problem List Items Addressed This Visit    None      Janene Madeira, MSN, NP-C [facility] Pager: [phone] Office: [phone]  11/23/18  10:27 PM

## 2018-11-24 ENCOUNTER — Ambulatory Visit: Payer: Self-pay

## 2018-11-24 ENCOUNTER — Encounter: Payer: Self-pay | Admitting: Infectious Diseases

## 2018-11-27 ENCOUNTER — Ambulatory Visit: Payer: Self-pay | Admitting: Infectious Diseases

## 2019-01-19 ENCOUNTER — Other Ambulatory Visit: Payer: Self-pay

## 2019-01-19 DIAGNOSIS — Z113 Encounter for screening for infections with a predominantly sexual mode of transmission: Secondary | ICD-10-CM

## 2019-01-19 DIAGNOSIS — Z79899 Other long term (current) drug therapy: Secondary | ICD-10-CM

## 2019-01-19 DIAGNOSIS — B20 Human immunodeficiency virus [HIV] disease: Secondary | ICD-10-CM

## 2019-01-20 LAB — T-HELPER CELL (CD4) - (RCID CLINIC ONLY)
CD4 % Helper T Cell: 14 % — ABNORMAL LOW (ref 33–55)
CD4 T Cell Abs: 380 /uL — ABNORMAL LOW (ref 400–2700)

## 2019-01-20 LAB — URINE CYTOLOGY ANCILLARY ONLY
Chlamydia: NEGATIVE
Neisseria Gonorrhea: NEGATIVE

## 2019-01-21 LAB — COMPREHENSIVE METABOLIC PANEL
AG Ratio: 1.3 (calc) (ref 1.0–2.5)
ALBUMIN MSPROF: 4.4 g/dL (ref 3.6–5.1)
ALT: 57 U/L — ABNORMAL HIGH (ref 9–46)
AST: 39 U/L (ref 10–40)
Alkaline phosphatase (APISO): 68 U/L (ref 36–130)
BUN/Creatinine Ratio: 9 (calc) (ref 6–22)
BUN: 14 mg/dL (ref 7–25)
CO2: 27 mmol/L (ref 20–32)
Calcium: 9.9 mg/dL (ref 8.6–10.3)
Chloride: 104 mmol/L (ref 98–110)
Creat: 1.63 mg/dL — ABNORMAL HIGH (ref 0.60–1.35)
GLOBULIN: 3.5 g/dL (ref 1.9–3.7)
Glucose, Bld: 82 mg/dL (ref 65–99)
POTASSIUM: 4.3 mmol/L (ref 3.5–5.3)
Sodium: 141 mmol/L (ref 135–146)
Total Bilirubin: 0.7 mg/dL (ref 0.2–1.2)
Total Protein: 7.9 g/dL (ref 6.1–8.1)

## 2019-01-21 LAB — CBC WITH DIFFERENTIAL/PLATELET
Absolute Monocytes: 340 cells/uL (ref 200–950)
Basophils Absolute: 28 cells/uL (ref 0–200)
Basophils Relative: 0.6 %
Eosinophils Absolute: 377 cells/uL (ref 15–500)
Eosinophils Relative: 8.2 %
HCT: 48.4 % (ref 38.5–50.0)
Hemoglobin: 15.6 g/dL (ref 13.2–17.1)
Lymphs Abs: 2461 cells/uL (ref 850–3900)
MCH: 27.4 pg (ref 27.0–33.0)
MCHC: 32.2 g/dL (ref 32.0–36.0)
MCV: 85.1 fL (ref 80.0–100.0)
MPV: 10.1 fL (ref 7.5–12.5)
Monocytes Relative: 7.4 %
Neutro Abs: 1394 cells/uL — ABNORMAL LOW (ref 1500–7800)
Neutrophils Relative %: 30.3 %
Platelets: 280 10*3/uL (ref 140–400)
RBC: 5.69 10*6/uL (ref 4.20–5.80)
RDW: 13.4 % (ref 11.0–15.0)
TOTAL LYMPHOCYTE: 53.5 %
WBC: 4.6 10*3/uL (ref 3.8–10.8)

## 2019-01-21 LAB — LIPID PANEL
Cholesterol: 266 mg/dL — ABNORMAL HIGH (ref <200)
HDL: 32 mg/dL — ABNORMAL LOW (ref >=40)
LDL Cholesterol (Calc): 191 mg/dL (calc) — ABNORMAL HIGH
Non-HDL Cholesterol (Calc): 234 mg/dL (calc) — ABNORMAL HIGH (ref <130)
Total CHOL/HDL Ratio: 8.3 (calc) — ABNORMAL HIGH (ref <5.0)
Triglycerides: 232 mg/dL — ABNORMAL HIGH (ref <150)

## 2019-01-21 LAB — RPR: RPR Ser Ql: NONREACTIVE

## 2019-01-21 LAB — HIV-1 RNA QUANT-NO REFLEX-BLD
HIV 1 RNA Quant: 72600 copies/mL — ABNORMAL HIGH
HIV-1 RNA Quant, Log: 4.86 Log copies/mL — ABNORMAL HIGH

## 2019-01-27 ENCOUNTER — Encounter: Payer: Self-pay | Admitting: Infectious Diseases

## 2019-02-03 ENCOUNTER — Telehealth: Payer: Self-pay | Admitting: Pharmacist

## 2019-02-03 ENCOUNTER — Ambulatory Visit (INDEPENDENT_AMBULATORY_CARE_PROVIDER_SITE_OTHER): Payer: Self-pay | Admitting: Licensed Clinical Social Worker

## 2019-02-03 ENCOUNTER — Encounter: Payer: Self-pay | Admitting: Infectious Disease

## 2019-02-03 ENCOUNTER — Ambulatory Visit (INDEPENDENT_AMBULATORY_CARE_PROVIDER_SITE_OTHER): Payer: Self-pay | Admitting: Infectious Disease

## 2019-02-03 ENCOUNTER — Telehealth: Payer: Self-pay | Admitting: Pharmacy Technician

## 2019-02-03 VITALS — BP 131/83 | HR 89 | Temp 98.4°F | Wt 270.0 lb

## 2019-02-03 DIAGNOSIS — B20 Human immunodeficiency virus [HIV] disease: Secondary | ICD-10-CM

## 2019-02-03 DIAGNOSIS — A5149 Other secondary syphilitic conditions: Secondary | ICD-10-CM

## 2019-02-03 DIAGNOSIS — F331 Major depressive disorder, recurrent, moderate: Secondary | ICD-10-CM

## 2019-02-03 DIAGNOSIS — Z23 Encounter for immunization: Secondary | ICD-10-CM

## 2019-02-03 MED ORDER — BICTEGRAVIR-EMTRICITAB-TENOFOV 50-200-25 MG PO TABS: 1.0000 | ORAL_TABLET | Freq: Every day | ORAL | 11 refills | Status: DC

## 2019-02-03 MED FILL — BIKTARVY 50-200-25 MG TABS: 50-200-25 | 30 days supply | Qty: 30 | Fill #0

## 2019-02-03 NOTE — Progress Notes (Signed)
Subjective:  Chief complaint returning to care is HIV disease   Patient ID: Luis Vincent, male    DOB: 1986/04/01, 33 y.o.   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   He apparently lost insurance and did not come in and get his HIV medication assistance program engaged.  We will therefore try to get him on to [place] using advancing access from [place].  He will also fill out paperwork today to renew his HIV medication assistance program and [employer].       Past Medical History:  Diagnosis Date  . Headache   . HIV infection ([place])   . Immune deficiency disorder ([place])   . Syphilis     No past surgical history on file.  No family history on file.    Social History   Socioeconomic History  . Marital status: Single    Spouse name: Not on file  . Number of children: Not on file  . Years of education: Not on file  . Highest education level: Not on file  Occupational History  . Not on file  Social Needs  . Financial resource strain: Not on file  . Food insecurity:    Worry: Not on file    Inability: Not on file  . Transportation needs:    Medical: Not on file    Non-medical: Not on file  Tobacco Use  . Smoking status: Never Smoker  . Smokeless tobacco: Never Used  Substance and Sexual Activity  . Alcohol use: No  . Drug use: No  . Sexual activity: Not Currently  Lifestyle  . Physical activity:    Days per week: Not on file    Minutes per session: Not on file  . Stress: Not on file  Relationships  . Social connections:    Talks on phone: Not on file    Gets together: Not on file    Attends religious service: Not on file    Active member of club or organization: Not on file    Attends meetings of clubs or  organizations: Not on file    Relationship status: Not on file  Other Topics Concern  . Not on file  Social History Narrative  . Not on file    No Known Allergies   Current Outpatient Medications:  .  abacavir-dolutegravir-lamiVUDine (TRIUMEQ) 600-50-300 MG tablet, Take 1 tablet by mouth daily., Disp: 30 tablet, Rfl: 2    Review of Systems  Constitutional: Negative for activity change, appetite change, chills, diaphoresis, fatigue, fever and unexpected weight change.  HENT: Negative for congestion, rhinorrhea, sinus pressure, sneezing, sore throat and trouble swallowing.   Eyes: Negative for photophobia and visual disturbance.  Respiratory: Negative for cough, chest tightness, shortness of breath, wheezing and stridor.   Cardiovascular: Negative for chest pain, palpitations and leg swelling.  Gastrointestinal: Negative for abdominal distention, abdominal pain, anal bleeding, blood in stool, constipation, diarrhea, nausea and vomiting.  Genitourinary: Negative for difficulty urinating, dysuria, flank pain and hematuria.  Musculoskeletal: Negative for arthralgias, back pain, gait problem, joint swelling and myalgias.  Skin: Negative for color change, pallor and wound.  Neurological: Negative for dizziness, tremors, weakness and light-headedness.  Hematological: Negative for adenopathy. Does not bruise/bleed easily.  Psychiatric/Behavioral: Negative for agitation, behavioral problems, confusion, decreased concentration, dysphoric mood  and sleep disturbance.       Objective:   Physical Exam  Constitutional: He is oriented to person, place, and time. He appears well-developed and well-nourished.  HENT:  Head: Normocephalic and atraumatic.  Eyes: Conjunctivae and EOM are normal.  Neck: Normal range of motion. Neck supple.  Cardiovascular: Normal rate and regular rhythm.  Pulmonary/Chest: Effort normal. No respiratory distress. He has no wheezes.  Abdominal: Soft. He exhibits no  distension.  Musculoskeletal: Normal range of motion.        General: No tenderness or edema.  Neurological: He is alert and oriented to person, place, and time.  Skin: Skin is warm and dry. No rash noted. No erythema.  Psychiatric: He has a normal mood and affect. His behavior is normal. Judgment and thought content normal.         Assessment & Plan:   HIV: Restart Biktarvy and check labs in 1 month's time.  Syphilis titers: NON-REACTIVE (02/11 1446)  I spent greater than 25 minutes with the patient including greater than 50% of time in face to face counsel of the patient regarding his new antiretroviral medication the importance of adhering to medications to restore and preserve immune health and render himself an infectious to sexual partners also regarding need for genital partners to be on preexposure prophylaxis if he is not on his own medications and controlled and in coordination of care.

## 2019-02-03 NOTE — Telephone Encounter (Signed)
Met with patient today while he was here seeing Dr. Tommy Medal. I was able to get him an immediate 30 day supply from [facility] and he will pick up at [facility] today.

## 2019-02-03 NOTE — Telephone Encounter (Addendum)
[facility] Patient Advocate Encounter   Was successful in obtaining a Chiropodist for [employer].  This assistance is good through 02/03/2020.  The billing information is as follows and has been shared with the patient.  RxBin: [id number] PCN: [id number] Member ID: [id number] Group ID: [id number]  Gridley Nadara Mustard Dola Patient [facility] Phone: [phone] Fax:  [phone]

## 2019-02-03 NOTE — BH Specialist Note (Signed)
Integrated Behavioral Health Initial Visit  MRN: [id number] Name: Luis Vincent  Number of [place] Clinician visits:: 1/6 Session Start time: 9:27am   Session End time: 10:00am Total time: 30 minutes  Type of Service: [place] Interpretor:No. Interpretor Name and Language: n/a   Warm Hand Off Completed.       SUBJECTIVE: Luis Vincent is a 33 y.o. male accompanied by self Patient was referred by Dr Tommy Medal for depressive symptoms. Patient reports the following symptoms/concerns: low energy and motivation, difficulty getting out of bed, crying spells, difficulty sleeping, trouble concentrating, irritability, depressed mood Duration of problem: 2 months; Severity of problem: moderate  OBJECTIVE: Mood: Depressed and Affect: Depressed Risk of harm to self or others: No plan to harm self or others  LIFE CONTEXT: Patient lives on his own, and is currently unemployed. He reports that he has supportive people in his life: boyfriend, aunt, grandmother and father. Patient wants to find work so that he can remain independent.  GOALS ADDRESSED: Patient will: 1. Reduce symptoms of: depression   INTERVENTIONS: Interventions utilized: Motivational Interviewing and Supportive Counseling   ASSESSMENT: Patient currently experiencing  low energy and motivation, difficulty getting out of bed, crying spells, insomnia, anhedonia, trouble concentrating, irritability, depressed mood and flat affect. The most appropriate diagnosis for his symptoms at this time is Major Depressive Disorder.  Counselor educated patient on counseling services available at [facility], including the scope of practice, scheduling, and financial accessibility. Patient identified that he would like to begin counseling to help him cope with situations in his life and to improve his mood. Counselor guided patient to identify stressors as he sees them, and goals regarding  them. Patient reports that he used to be very close to his mother, but when he came out to her everything changed. She refuses to accept that he is gay, and this has caused a rift not only between them but between her and his father, who is supportive of patient. The loss of this significant relationship is very profound for patient. In addition, patient's partner of 2 years is not out to his family, and his mother lives with him. Counselor processed with patient how all this impacts his mood. Patient reports that although he believes he did the right thing by being honest with his mother, a part of him regrets it and wishes he had kept his sexual orientation a secret so that the relationship would not have changed. He states that he thinks about this a lot, and it compounds his feelings of depression.   Patient may benefit from weekly counseling sessions. PLAN: 1. Follow up with behavioral health clinician on :   Lillie Fragmin, LCSW

## 2019-02-04 LAB — CYTOLOGY, (ORAL, ANAL, URETHRAL) ANCILLARY ONLY
CHLAMYDIA, DNA PROBE: NEGATIVE
CHLAMYDIA, DNA PROBE: NEGATIVE
NEISSERIA GONORRHEA: NEGATIVE
Neisseria Gonorrhea: NEGATIVE

## 2019-02-08 ENCOUNTER — Institutional Professional Consult (permissible substitution): Payer: Self-pay | Admitting: Licensed Clinical Social Worker

## 2019-03-16 ENCOUNTER — Other Ambulatory Visit: Payer: Self-pay

## 2019-03-26 ENCOUNTER — Other Ambulatory Visit: Payer: Self-pay

## 2019-03-26 ENCOUNTER — Other Ambulatory Visit: Payer: Self-pay | Admitting: *Deleted

## 2019-03-30 ENCOUNTER — Encounter: Payer: Self-pay | Admitting: Infectious Disease

## 2019-09-14 ENCOUNTER — Other Ambulatory Visit: Payer: Self-pay

## 2019-09-14 ENCOUNTER — Ambulatory Visit: Payer: Self-pay

## 2019-09-14 DIAGNOSIS — B20 Human immunodeficiency virus [HIV] disease: Secondary | ICD-10-CM

## 2019-09-15 ENCOUNTER — Encounter: Payer: Self-pay | Admitting: Infectious Disease

## 2019-09-15 LAB — T-HELPER CELL (CD4) - (RCID CLINIC ONLY)
CD4 % Helper T Cell: 13 % — ABNORMAL LOW (ref 33–65)
CD4 T Cell Abs: 249 /uL — ABNORMAL LOW (ref 400–1790)

## 2019-09-21 ENCOUNTER — Other Ambulatory Visit: Payer: Self-pay | Admitting: *Deleted

## 2019-09-21 DIAGNOSIS — B20 Human immunodeficiency virus [HIV] disease: Secondary | ICD-10-CM

## 2019-09-21 MED ORDER — BIKTARVY 50-200-25 MG PO TABS: 1.0000 | ORAL_TABLET | Freq: Every day | ORAL | 5 refills | Status: DC

## 2019-09-30 LAB — COMPLETE METABOLIC PANEL WITH GFR
AG Ratio: 1.5 (calc) (ref 1.0–2.5)
ALT: 28 U/L (ref 9–46)
AST: 23 U/L (ref 10–40)
Albumin: 4 g/dL (ref 3.6–5.1)
Alkaline phosphatase (APISO): 47 U/L (ref 36–130)
BUN/Creatinine Ratio: 11 (calc) (ref 6–22)
BUN: 16 mg/dL (ref 7–25)
CO2: 28 mmol/L (ref 20–32)
Calcium: 9.6 mg/dL (ref 8.6–10.3)
Chloride: 102 mmol/L (ref 98–110)
Creat: 1.44 mg/dL — ABNORMAL HIGH (ref 0.60–1.35)
GFR, Est African American: 73 mL/min/{1.73_m2} (ref >=60)
GFR, Est Non African American: 63 mL/min/{1.73_m2} (ref >=60)
Globulin: 2.6 g/dL (calc) (ref 1.9–3.7)
Glucose, Bld: 77 mg/dL (ref 65–99)
Potassium: 3.8 mmol/L (ref 3.5–5.3)
Sodium: 135 mmol/L (ref 135–146)
Total Bilirubin: 0.8 mg/dL (ref 0.2–1.2)
Total Protein: 6.6 g/dL (ref 6.1–8.1)

## 2019-09-30 LAB — CBC WITH DIFFERENTIAL/PLATELET
Absolute Monocytes: 475 cells/uL (ref 200–950)
Basophils Absolute: 49 cells/uL (ref 0–200)
Basophils Relative: 1 %
Eosinophils Absolute: 78 cells/uL (ref 15–500)
Eosinophils Relative: 1.6 %
HCT: 42.5 % (ref 38.5–50.0)
Hemoglobin: 14 g/dL (ref 13.2–17.1)
Lymphs Abs: 1911 cells/uL (ref 850–3900)
MCH: 28.9 pg (ref 27.0–33.0)
MCHC: 32.9 g/dL (ref 32.0–36.0)
MCV: 87.6 fL (ref 80.0–100.0)
MPV: 10.1 fL (ref 7.5–12.5)
Monocytes Relative: 9.7 %
Neutro Abs: 2386 cells/uL (ref 1500–7800)
Neutrophils Relative %: 48.7 %
Platelets: 267 10*3/uL (ref 140–400)
RBC: 4.85 10*6/uL (ref 4.20–5.80)
RDW: 13.1 % (ref 11.0–15.0)
Total Lymphocyte: 39 %
WBC: 4.9 10*3/uL (ref 3.8–10.8)

## 2019-09-30 LAB — HIV-1 GENOTYPE: HIV-1 Genotype: DETECTED — AB

## 2019-09-30 LAB — HIV RNA, RTPCR W/R GT (RTI, PI,INT)
HIV 1 RNA Quant: 417000 copies/mL — ABNORMAL HIGH
HIV-1 RNA Quant, Log: 5.62 Log copies/mL — ABNORMAL HIGH

## 2019-09-30 LAB — HIV-1 INTEGRASE GENOTYPE

## 2019-09-30 LAB — RPR: RPR Ser Ql: NONREACTIVE

## 2019-10-03 ENCOUNTER — Encounter (HOSPITAL_COMMUNITY): Payer: Self-pay

## 2019-10-03 ENCOUNTER — Other Ambulatory Visit: Payer: Self-pay

## 2019-10-03 ENCOUNTER — Emergency Department (HOSPITAL_COMMUNITY)
Admission: EM | Admit: 2019-10-03 | Discharge: 2019-10-03 | Disposition: A | Discharge status: Home / Self Care | Payer: Self-pay | Attending: Emergency Medicine | Admitting: Emergency Medicine

## 2019-10-03 DIAGNOSIS — R369 Urethral discharge, unspecified: Secondary | ICD-10-CM | POA: Insufficient documentation

## 2019-10-03 DIAGNOSIS — Z21 Asymptomatic human immunodeficiency virus [HIV] infection status: Secondary | ICD-10-CM | POA: Insufficient documentation

## 2019-10-03 DIAGNOSIS — Z79899 Other long term (current) drug therapy: Secondary | ICD-10-CM | POA: Insufficient documentation

## 2019-10-03 LAB — URINALYSIS, ROUTINE W REFLEX MICROSCOPIC
Bacteria, UA: NONE SEEN
Bilirubin Urine: NEGATIVE
Glucose, UA: NEGATIVE mg/dL
Ketones, ur: NEGATIVE mg/dL
Nitrite: NEGATIVE
Protein, ur: NEGATIVE mg/dL
Specific Gravity, Urine: 1.01 (ref 1.005–1.030)
WBC, UA: >50 WBC/hpf — ABNORMAL HIGH (ref 0–5)
pH: 6 (ref 5.0–8.0)

## 2019-10-03 MED ORDER — STERILE WATER FOR INJECTION IJ SOLN
INTRAMUSCULAR | Status: AC
  Administered 2019-10-03: 0.9 mL
  Filled 2019-10-03: qty 10

## 2019-10-03 MED ORDER — CEFTRIAXONE SODIUM 250 MG IJ SOLR
250.0000 mg | Freq: Once | INTRAMUSCULAR | Status: AC
  Administered 2019-10-03: 250 mg via INTRAMUSCULAR
  Filled 2019-10-03: qty 250

## 2019-10-03 MED ORDER — AZITHROMYCIN 250 MG PO TABS
1000.0000 mg | ORAL_TABLET | Freq: Once | ORAL | Status: AC
  Administered 2019-10-03: 1000 mg via ORAL
  Filled 2019-10-03: qty 4

## 2019-10-03 NOTE — ED Provider Notes (Signed)
[facility] EMERGENCY DEPARTMENT Provider Note   CSN: [id number] Arrival date & time: 10/03/19  [id number]     History   Chief Complaint No chief complaint on file.   HPI Luis Vincent is a 33 y.o. male with history of HIV, syphilis who presents with a 2-day history of penile discharge.  He has concern for STD exposure.  He denies any urinary symptoms or scrotal pain or swelling.  Patient denies any abdominal pain, fevers, nausea, vomiting.  No over-the-counter medications taken.     HPI  Past Medical History:  Diagnosis Date  . Headache   . HIV infection ([place])   . Immune deficiency disorder ([place])   . Syphilis     Patient Active Problem List   Diagnosis Date Noted  . Tinea corporis 03/29/2015  . HIV disease ([place]) 02/17/2014  . 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 No pertinent surgical history.      Home Medications    Prior to Admission medications   Medication Sig Start Date End Date Taking? Authorizing Provider  bictegravir-emtricitabine-tenofovir AF (BIKTARVY) 50-200-25 MG TABS tablet Take 1 tablet by mouth daily. 09/21/19   Truman Hayward, MD    Family History No family history on file.  Social History Social History   Tobacco Use  . Smoking status: Never Smoker  . Smokeless tobacco: Never Used  Substance Use Topics  . Alcohol use: No  . Drug use: No     Allergies   Patient has no known allergies.   Review of Systems Review of Systems  Constitutional: Negative for chills and fever.  HENT: Negative for facial swelling and sore throat.   Respiratory: Negative for shortness of breath.   Cardiovascular: Negative for chest pain.  Gastrointestinal: Negative for abdominal pain, nausea and vomiting.  Genitourinary: Positive for discharge. Negative for dysuria, penile pain, penile swelling, scrotal swelling and testicular pain.  Musculoskeletal: Negative for back pain.  Skin: Negative for rash and wound.   Neurological: Negative for headaches.  Psychiatric/Behavioral: The patient is not nervous/anxious.      Physical Exam Updated Vital Signs BP 138/79   Pulse 78   Temp 98.6 F (37 C) (Oral)   Resp 18   SpO2 100%   Physical Exam Vitals signs and nursing note reviewed. Exam conducted with a chaperone present.  Constitutional:      General: He is not in acute distress.    Appearance: He is well-developed. He is not diaphoretic.  HENT:     Head: Normocephalic and atraumatic.  Eyes:     General: No scleral icterus.       Right eye: No discharge.        Left eye: No discharge.     Conjunctiva/sclera: Conjunctivae normal.     Pupils: Pupils are equal, round, and reactive to light.  Neck:     Musculoskeletal: Normal range of motion and neck supple.     Thyroid: No thyromegaly.  Cardiovascular:     Rate and Rhythm: Normal rate and regular rhythm.     Heart sounds: Normal heart sounds. No murmur. No friction rub. No gallop.   Pulmonary:     Effort: Pulmonary effort is normal. No respiratory distress.     Breath sounds: Normal breath sounds. No stridor. No wheezing or rales.  Abdominal:     General: Bowel sounds are normal. There is no distension.     Palpations: Abdomen is soft.     Tenderness: There  is no abdominal tenderness. There is no guarding or rebound.  Genitourinary:    Penis: Discharge present.      Scrotum/Testes: Normal.  Lymphadenopathy:     Cervical: No cervical adenopathy.  Skin:    General: Skin is warm and dry.     Coloration: Skin is not pale.     Findings: No rash.  Neurological:     Mental Status: He is alert.     Coordination: Coordination normal.      ED Treatments / Results  Labs (all labs ordered are listed, but only abnormal results are displayed) Labs Reviewed  URINALYSIS, ROUTINE W REFLEX MICROSCOPIC - Abnormal; Notable for the following components:      Result Value   APPearance CLOUDY (*)    Hgb urine dipstick SMALL (*)     Leukocytes,Ua LARGE (*)    WBC, UA >50 (*)    All other components within normal limits  URINE CULTURE  GC/CHLAMYDIA PROBE AMP ([place]) NOT AT [facility]    EKG None  Radiology No results found.  Procedures Procedures (including critical care time)  Medications Ordered in ED Medications  cefTRIAXone (ROCEPHIN) injection 250 mg (250 mg Intramuscular Given 10/03/19 1233)  azithromycin (ZITHROMAX) tablet 1,000 mg (1,000 mg Oral Given 10/03/19 1232)  sterile water (preservative free) injection (0.9 mLs  Given 10/03/19 1235)     Initial Impression / Assessment and Plan / ED Course  I have reviewed the triage vital signs and the nursing notes.  Pertinent labs & imaging results that were available during my care of the patient were reviewed by me and considered in my medical decision making (see chart for details).        Patient treated in the ED for STI with Rocephin, azithromycin. Patient advised to inform and treat all sexual partners.  Pt advised on safe sex practices and understands that they have GC/Chlamydia cultures pending and will result in 2-3 days. HIV and RPR deferred, patient already HIV positive. Pt encouraged to follow up at local health department for future STI checks. No concern for prostatitis or epididymitis. Discussed return precautions. Pt appears safe for discharge.    Final Clinical Impressions(s) / ED Diagnoses   Final diagnoses:  Penile discharge    ED Discharge Orders    None       Frederica Kuster, PA-C 10/03/19 1321    Little, Wenda Overland, MD 10/05/19 1338

## 2019-10-03 NOTE — ED Notes (Signed)
Pt aware of need for urine specimen. 

## 2019-10-03 NOTE — ED Triage Notes (Signed)
Patient complains of 2 days of penile pain and discharge. Wants to be checked for STD

## 2019-10-03 NOTE — Discharge Instructions (Signed)
You have been treated for gonorrhea and chlamydia today. You will be called in 3 days if any of your tests return positive or require an additional antibiotic. In that case, please make all of your sexual partners aware that they will need to be treated as well. Abstain from intercourse for one week until you have both been treated. Use condoms in the future to help prevent sexually transmitted disease and unwanted pregnancy. You can go to the health department in the future for free STD testing.

## 2019-10-03 NOTE — ED Notes (Signed)
Patient verbalizes understanding of discharge instructions . Opportunity for questions and answers were provided . Armband removed by staff ,Pt discharged from ED. W/C  offered at D/C  and Declined W/C at D/C and was escorted to lobby by RN.  

## 2019-10-04 LAB — URINE CULTURE: Culture: NO GROWTH

## 2019-10-05 LAB — GC/CHLAMYDIA PROBE AMP (~~LOC~~) NOT AT ARMC
Chlamydia: NEGATIVE
Neisseria Gonorrhea: POSITIVE — AB

## 2019-10-07 ENCOUNTER — Encounter: Payer: Self-pay | Admitting: Infectious Disease

## 2019-11-03 ENCOUNTER — Other Ambulatory Visit: Payer: Self-pay

## 2019-11-03 ENCOUNTER — Encounter (HOSPITAL_COMMUNITY): Payer: Self-pay

## 2019-11-03 ENCOUNTER — Emergency Department (HOSPITAL_COMMUNITY)
Admission: EM | Admit: 2019-11-03 | Discharge: 2019-11-03 | Disposition: A | Discharge status: Home / Self Care | Payer: Self-pay | Attending: Emergency Medicine | Admitting: Emergency Medicine

## 2019-11-03 ENCOUNTER — Emergency Department (HOSPITAL_COMMUNITY): Payer: Self-pay

## 2019-11-03 DIAGNOSIS — D171 Benign lipomatous neoplasm of skin and subcutaneous tissue of trunk: Secondary | ICD-10-CM | POA: Insufficient documentation

## 2019-11-03 DIAGNOSIS — B20 Human immunodeficiency virus [HIV] disease: Secondary | ICD-10-CM | POA: Insufficient documentation

## 2019-11-03 DIAGNOSIS — E669 Obesity, unspecified: Secondary | ICD-10-CM | POA: Insufficient documentation

## 2019-11-03 DIAGNOSIS — Z6834 Body mass index (BMI) 34.0-34.9, adult: Secondary | ICD-10-CM | POA: Insufficient documentation

## 2019-11-03 LAB — CBC
HCT: 46.8 % (ref 39.0–52.0)
Hemoglobin: 15.5 g/dL (ref 13.0–17.0)
MCH: 29.2 pg (ref 26.0–34.0)
MCHC: 33.1 g/dL (ref 30.0–36.0)
MCV: 88.1 fL (ref 80.0–100.0)
Platelets: 268 10*3/uL (ref 150–400)
RBC: 5.31 MIL/uL (ref 4.22–5.81)
RDW: 12.1 % (ref 11.5–15.5)
WBC: 4.6 10*3/uL (ref 4.0–10.5)
nRBC: 0 % (ref 0.0–0.2)

## 2019-11-03 LAB — URINALYSIS, ROUTINE W REFLEX MICROSCOPIC
Bacteria, UA: NONE SEEN
Bilirubin Urine: NEGATIVE
Glucose, UA: NEGATIVE mg/dL
Hgb urine dipstick: NEGATIVE
Ketones, ur: NEGATIVE mg/dL
Nitrite: NEGATIVE
Protein, ur: NEGATIVE mg/dL
Specific Gravity, Urine: 1.025 (ref 1.005–1.030)
pH: 5 (ref 5.0–8.0)

## 2019-11-03 LAB — COMPREHENSIVE METABOLIC PANEL
ALT: 33 U/L (ref 0–44)
AST: 24 U/L (ref 15–41)
Albumin: 3.9 g/dL (ref 3.5–5.0)
Alkaline Phosphatase: 53 U/L (ref 38–126)
Anion gap: 8 (ref 5–15)
BUN: 7 mg/dL (ref 6–20)
CO2: 26 mmol/L (ref 22–32)
Calcium: 9.4 mg/dL (ref 8.9–10.3)
Chloride: 104 mmol/L (ref 98–111)
Creatinine, Ser: 1.56 mg/dL — ABNORMAL HIGH (ref 0.61–1.24)
GFR calc Af Amer: >60 mL/min (ref >60)
GFR calc non Af Amer: 57 mL/min — ABNORMAL LOW (ref >60)
Glucose, Bld: 90 mg/dL (ref 70–99)
Potassium: 4 mmol/L (ref 3.5–5.1)
Sodium: 138 mmol/L (ref 135–145)
Total Bilirubin: 0.9 mg/dL (ref 0.3–1.2)
Total Protein: 7.3 g/dL (ref 6.5–8.1)

## 2019-11-03 LAB — LIPASE, BLOOD: Lipase: 24 U/L (ref 11–51)

## 2019-11-03 MED ORDER — SODIUM CHLORIDE 0.9% FLUSH: 3.0000 mL | Freq: Once | INTRAVENOUS | Status: DC

## 2019-11-03 MED ORDER — IOHEXOL 300 MG/ML  SOLN
100.0000 mL | Freq: Once | INTRAMUSCULAR | Status: AC | PRN
  Administered 2019-11-03: 100 mL via INTRAVENOUS

## 2019-11-03 NOTE — Discharge Instructions (Addendum)
Follow-up with general surgery if you would like to consult surgery for possible removal of your lipoma.

## 2019-11-03 NOTE — ED Notes (Signed)
Patient verbalizes understanding of discharge instructions. Opportunity for questioning and answers were provided. Armband removed by staff, pt discharged from ED.  

## 2019-11-03 NOTE — ED Provider Notes (Signed)
[facility] EMERGENCY DEPARTMENT Provider Note   CSN: [id number] Arrival date & time: 11/03/19  1056     History   Chief Complaint Chief Complaint  Patient presents with  . Abdominal Pain    HPI Emilson Conceicao is a 33 y.o. male.     33 year old male with history of HIV, CD4 294 1 month ago, presents with complaint of a lump on the right side of his abdomen, first noticed 3 weeks ago, progressively growing in size, mildly tender.  Denies nausea, vomiting, changes in bowel or bladder habits.  Patient reports occasionally having blood in his stool, not recently.  No other complaints or concerns.  No prior abdominal surgeries.     Past Medical History:  Diagnosis Date  . Headache   . HIV infection ([place])   . Immune deficiency disorder ([place])   . Syphilis     Patient Active Problem List   Diagnosis Date Noted  . Tinea corporis 03/29/2015  . HIV disease ([place]) 02/17/2014  . 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 No pertinent surgical history.      Home Medications    Prior to Admission medications   Medication Sig Start Date End Date Taking? Authorizing Provider  bictegravir-emtricitabine-tenofovir AF (BIKTARVY) 50-200-25 MG TABS tablet Take 1 tablet by mouth daily. 09/21/19   Truman Hayward, MD    Family History No family history on file.  Social History Social History   Tobacco Use  . Smoking status: Never Smoker  . Smokeless tobacco: Never Used  Substance Use Topics  . Alcohol use: No  . Drug use: No     Allergies   Patient has no known allergies.   Review of Systems Review of Systems  Constitutional: Negative for fever.  Respiratory: Negative for shortness of breath.   Cardiovascular: Negative for chest pain.  Gastrointestinal: Negative for abdominal pain, constipation, diarrhea, nausea and vomiting.  Genitourinary: Negative for difficulty urinating and dysuria.  Skin: Negative for rash and wound.   Allergic/Immunologic: Positive for immunocompromised state.  Neurological: Negative for weakness.  Hematological: Negative for adenopathy.  Psychiatric/Behavioral: Negative for confusion.  All other systems reviewed and are negative.    Physical Exam Updated Vital Signs BP (!) 142/87   Pulse (!) 57   Temp 98.6 F (37 C) (Oral)   Resp 16   Ht 5\' 11"  (1.803 m)   Wt 111.1 kg   SpO2 100%   BMI 34.17 kg/m   Physical Exam Vitals signs and nursing note reviewed.  Constitutional:      General: He is not in acute distress.    Appearance: He is well-developed. He is obese. He is not diaphoretic.  HENT:     Head: Normocephalic and atraumatic.  Cardiovascular:     Rate and Rhythm: Normal rate and regular rhythm.     Heart sounds: Normal heart sounds.  Pulmonary:     Effort: Pulmonary effort is normal.     Breath sounds: Normal breath sounds.  Abdominal:     Palpations: Abdomen is soft.     Tenderness: There is no abdominal tenderness.     Comments: Approximately 13 cm x 8 cm soft mildly tender mass to the right flank area just below the right midaxillary ribs, unable to reduce with palpation  Skin:    General: Skin is warm and dry.     Findings: No rash.  Neurological:     Mental Status: He is alert and oriented to  person, place, and time.  Psychiatric:        Behavior: Behavior normal.      ED Treatments / Results  Labs (all labs ordered are listed, but only abnormal results are displayed) Labs Reviewed  COMPREHENSIVE METABOLIC PANEL - Abnormal; Notable for the following components:      Result Value   Creatinine, Ser 1.56 (*)    GFR calc non Af Amer 57 (*)    All other components within normal limits  URINALYSIS, ROUTINE W REFLEX MICROSCOPIC - Abnormal; Notable for the following components:   Leukocytes,Ua SMALL (*)    All other components within normal limits  LIPASE, BLOOD  CBC    EKG None  Radiology Ct Abdomen Pelvis W Contrast  Result Date: 11/03/2019  CLINICAL DATA:  Enlarging mass on the medial aspect of the right abdomen for 3 weeks. EXAM: CT ABDOMEN AND PELVIS WITH CONTRAST TECHNIQUE: Multidetector CT imaging of the abdomen and pelvis was performed using the standard protocol following bolus administration of intravenous contrast. CONTRAST:  100 mL OMNIPAQUE IOHEXOL 300 MG/ML  SOLN COMPARISON:  None. FINDINGS: Lower chest: Lung bases are clear. No pleural or pericardial effusion. Hepatobiliary: No focal liver abnormality is seen. No gallstones, gallbladder wall thickening, or biliary dilatation. Pancreas: Unremarkable. No pancreatic ductal dilatation or surrounding inflammatory changes. Spleen: Normal in size without focal abnormality. Adrenals/Urinary Tract: No adrenal hemorrhage or renal injury identified. Walls of the urinary bladder are thickened although the bladder is incompletely distended. Stomach/Bowel: Stomach is within normal limits. Appendix appears normal. No evidence of bowel wall thickening, distention, or inflammatory changes. Vascular/Lymphatic: No significant vascular findings are present. No enlarged abdominal or pelvic lymph nodes. Reproductive: Prostate is unremarkable. Other: In the subcutaneous fatty tissues along the lateral aspect of the right abdomen, there is a lesion measuring 7.2 cm AP x 7.3 cm transverse x 7 cm craniocaudal. The lesion has a thin capsule. It demonstrates homogeneous fat attenuation throughout without a soft tissue component or septation. Musculoskeletal: No acute or focal abnormality. Convex left scoliosis and degenerative disc disease L5-S1 noted. IMPRESSION: 1. 7.2 x 7.3 x 7 cm in the subcutaneous fatty tissues along the lateral aspect of the right abdomen has an appearance consistent with a simple lipoma. 2. Walls of the urinary bladder are thickened but the bladder is incompletely distended. Recommend correlation with urinalysis if indicated. 3. Convex left scoliosis and degenerative disc disease L5-S1.  Electronically Signed   By: Inge Rise M.D.   On: 11/03/2019 14:03    Procedures Procedures (including critical care time)  Medications Ordered in ED Medications  sodium chloride flush (NS) 0.9 % injection 3 mL (3 mLs Intravenous Not Given 11/03/19 1225)  iohexol (OMNIPAQUE) 300 MG/ML solution 100 mL (100 mLs Intravenous Contrast Given 11/03/19 1354)     Initial Impression / Assessment and Plan / ED Course  I have reviewed the triage vital signs and the nursing notes.  Pertinent labs & imaging results that were available during my care of the patient were reviewed by me and considered in my medical decision making (see chart for details).  Clinical Course as of Nov 03 1411  Wed Nov 25, 589  7711 33 year old male with complaint of right flank mass x3 weeks, progressively worsening.  On exam has large moderately tender, soft, mobile mass to the right mid axillary line just below the right lower ribs.  No overlying skin changes.  Question hernia versus lipoma, do not suspect abscess.  Lab work without significant changes,  CT with findings of soft tissue mass most consistent with lipoma.  Patient referred to general surgery if he would like to discuss removal of area.   [LM]    Clinical Course User Index [LM] Tacy Learn, PA-C      Final Clinical Impressions(s) / ED Diagnoses   Final diagnoses:  Lipoma of torso    ED Discharge Orders    None       Roque Lias 11/03/19 1412    Isla Pence, MD 11/03/19 1447

## 2019-11-03 NOTE — ED Triage Notes (Signed)
Pt reports a knot on his right side that has been there for about 2 weeks and has gotten bigger over time. No open wound or drainage noted. Pt also reports intermittent bright red blood in his stools, last time he noticed it was three days ago. Denies n/v. Pt a.o

## 2019-11-15 ENCOUNTER — Encounter: Payer: Self-pay | Admitting: Infectious Disease

## 2019-11-15 NOTE — Progress Notes (Signed)
Patient ID: Luis Vincent, male   DOB: Feb 10, 1986, 33 y.o.   MRN: [id number] Called patient to schedule appointment  Mail box full  [place]

## 2019-12-09 ENCOUNTER — Emergency Department (HOSPITAL_COMMUNITY)
Admission: EM | Admit: 2019-12-09 | Discharge: 2019-12-09 | Disposition: A | Discharge status: Home / Self Care | Payer: HRSA Program | Attending: Emergency Medicine | Admitting: Emergency Medicine

## 2019-12-09 ENCOUNTER — Other Ambulatory Visit: Payer: Self-pay

## 2019-12-09 ENCOUNTER — Encounter (HOSPITAL_COMMUNITY): Payer: Self-pay | Admitting: Emergency Medicine

## 2019-12-09 ENCOUNTER — Emergency Department (HOSPITAL_COMMUNITY): Payer: HRSA Program

## 2019-12-09 DIAGNOSIS — Z20822 Contact with and (suspected) exposure to covid-19: Secondary | ICD-10-CM

## 2019-12-09 DIAGNOSIS — M7918 Myalgia, other site: Secondary | ICD-10-CM | POA: Insufficient documentation

## 2019-12-09 DIAGNOSIS — R0982 Postnasal drip: Secondary | ICD-10-CM | POA: Insufficient documentation

## 2019-12-09 DIAGNOSIS — U071 COVID-19: Secondary | ICD-10-CM | POA: Diagnosis not present

## 2019-12-09 DIAGNOSIS — B20 Human immunodeficiency virus [HIV] disease: Secondary | ICD-10-CM | POA: Diagnosis not present

## 2019-12-09 DIAGNOSIS — R05 Cough: Secondary | ICD-10-CM | POA: Diagnosis present

## 2019-12-09 DIAGNOSIS — Z79899 Other long term (current) drug therapy: Secondary | ICD-10-CM | POA: Insufficient documentation

## 2019-12-09 DIAGNOSIS — R059 Cough, unspecified: Secondary | ICD-10-CM

## 2019-12-09 LAB — NOVEL CORONAVIRUS, NAA (HOSP ORDER, SEND-OUT TO REF LAB; TAT 18-24 HRS): SARS-CoV-2, NAA: DETECTED — AB

## 2019-12-09 MED ORDER — FLUTICASONE PROPIONATE 50 MCG/ACT NA SUSP: 1.0000 | Freq: Every day | NASAL | 0 refills | Status: DC

## 2019-12-09 MED ORDER — BENZONATATE 100 MG PO CAPS: 100.0000 mg | ORAL_CAPSULE | Freq: Three times a day (TID) | ORAL | 0 refills | Status: DC

## 2019-12-09 NOTE — Discharge Instructions (Signed)
Take the medications as prescribed. Follow up with the infectious disease doctors for your normal follow up appointment. It looks like they have been trying to reach you for an appointment.   Return for new or worsening symptoms.

## 2019-12-09 NOTE — ED Triage Notes (Signed)
Pt. Stated, for 2 days Ivwe had body aches and a cough. I found out that my cousin has COVID

## 2019-12-09 NOTE — ED Provider Notes (Signed)
[facility] EMERGENCY DEPARTMENT Provider Note   CSN: [id number] Arrival date & time: 12/09/19  [id number]    History Chief Complaint  Patient presents with  . Cough  . Generalized Body Aches   Luis Vincent is a 33 y.o. male with past medical history significant for HIV, taking medications, last CD4 count 249 who presents for evaluation of cough and body aches. States he was around his cousin who is COVID positive. States cough resolved mostly however he is concerned about his COVID exposure.  Has had some mild body aches and pains, clear congestion.  Denies fever, chills, nausea, vomiting, headache, blurred vision, neck pain, neck stiffness, chest pain, shortness of breath, hemoptysis, abdominal pain, diarrhea, dysuria, unilateral weakness.  Has not take anything for symptoms.  Denies additional aggravating or alleviating factors.  He is followed by Dr. Drucilla Schmidt from infectious disease.  Denies additional aggravating or alleviating factors.  History obtained from patient past and past medical records. No interpretor was used.  HPI    Past Medical History:  Diagnosis Date  . Headache   . HIV infection ([place])   . Immune deficiency disorder ([place])   . Syphilis     Patient Active Problem List   Diagnosis Date Noted  . Tinea corporis 03/29/2015  . HIV disease ([place]) 02/17/2014  . 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 No pertinent surgical history.     No family history on file.  Social History   Tobacco Use  . Smoking status: Never Smoker  . Smokeless tobacco: Never Used  Substance Use Topics  . Alcohol use: No  . Drug use: No    Home Medications Prior to Admission medications   Medication Sig Start Date End Date Taking? Authorizing Provider  benzonatate (TESSALON) 100 MG capsule Take 1 capsule (100 mg total) by mouth every 8 (eight) hours. 12/09/19   Bentleigh Stankus A, PA-C  bictegravir-emtricitabine-tenofovir AF (BIKTARVY) 50-200-25  MG TABS tablet Take 1 tablet by mouth daily. 09/21/19   Truman Hayward, MD  fluticasone [facility]) 50 MCG/ACT nasal spray Place 1 spray into both nostrils daily. 12/09/19   Christal Lagerstrom A, PA-C    Allergies    Patient has no known allergies.  Review of Systems   Review of Systems  Constitutional: Negative.   HENT: Positive for postnasal drip and rhinorrhea. Negative for congestion, drooling, ear discharge, ear pain, facial swelling, sinus pressure, sinus pain, sneezing, sore throat and voice change.   Respiratory: Positive for cough. Negative for apnea, choking, chest tightness, shortness of breath, wheezing and stridor.   Cardiovascular: Negative.   Gastrointestinal: Negative.   Genitourinary: Negative.   Musculoskeletal: Positive for myalgias. Negative for arthralgias, back pain, gait problem, joint swelling, neck pain and neck stiffness.  Skin: Negative.   Neurological: Negative.   All other systems reviewed and are negative.   Physical Exam Updated Vital Signs BP 131/69 (BP Location: Left Arm)   Pulse 69   Temp 97.7 F (36.5 C) (Oral)   Resp 20   Ht 5\' 11"  (1.803 m)   Wt 111.1 kg   SpO2 98%   BMI 34.17 kg/m   Physical Exam Vitals and nursing note reviewed.  Constitutional:      General: He is not in acute distress.    Appearance: He is not ill-appearing, toxic-appearing or diaphoretic.  HENT:     Head: Normocephalic and atraumatic.     Jaw: There is normal jaw occlusion.  Right Ear: Tympanic membrane, ear canal and external ear normal. There is no impacted cerumen. No hemotympanum. Tympanic membrane is not injected, scarred, perforated, erythematous, retracted or bulging.     Left Ear: Tympanic membrane, ear canal and external ear normal. There is no impacted cerumen. No hemotympanum. Tympanic membrane is not injected, scarred, perforated, erythematous, retracted or bulging.     Ears:     Comments: No Mastoid tenderness.    Nose:     Comments: Clear  rhinorrhea and congestion to bilateral nares.  No sinus tenderness.    Mouth/Throat:     Comments: Posterior oropharynx clear.  Mucous membranes moist.  Tonsils without erythema or exudate.  Uvula midline without deviation.  No evidence of PTA or RPA.  No drooling, dysphasia or trismus.  Phonation normal. Neck:     Trachea: Trachea and phonation normal.     Meningeal: Brudzinski's sign and Kernig's sign absent.     Comments: No Neck stiffness or neck rigidity.  No meningismus.  No cervical lymphadenopathy. Cardiovascular:     Comments: No murmurs rubs or gallops. Pulmonary:     Comments: Clear to auscultation bilaterally without wheeze, rhonchi or rales.  No accessory muscle usage.  Able speak in full sentences. Abdominal:     Comments: Soft, nontender without rebound or guarding.  No CVA tenderness.  Musculoskeletal:     Comments: Moves all 4 extremities without difficulty.  Lower extremities without edema, erythema or warmth.  Skin:    Comments: Brisk capillary refill.  No rashes or lesions.  Neurological:     Mental Status: He is alert.     Comments: Ambulatory in department without difficulty.  Cranial nerves II through XII grossly intact.  No facial droop.  No aphasia.     ED Results / Procedures / Treatments   Labs (all labs ordered are listed, but only abnormal results are displayed) Labs Reviewed  NOVEL CORONAVIRUS, NAA (HOSP ORDER, SEND-OUT TO REF LAB; TAT 18-24 HRS)    EKG None  Radiology DG Chest Portable 1 View  Result Date: 12/09/2019 CLINICAL DATA:  33 year old male with history of 2 days of body aches and cough. EXAM: PORTABLE CHEST 1 VIEW COMPARISON:  Chest x-ray 09/14/2018. FINDINGS: Lung volumes are normal. No consolidative airspace disease. No pleural effusions. No pneumothorax. No pulmonary nodule or mass noted. Pulmonary vasculature and the cardiomediastinal silhouette are within normal limits. IMPRESSION: No radiographic evidence of acute cardiopulmonary  disease. Electronically Signed   By: Vinnie Langton M.D.   On: 12/09/2019 10:12    Procedures Procedures (including critical care time)  Medications Ordered in ED Medications - No data to display  ED Course  I have reviewed the triage vital signs and the nursing notes.  Pertinent labs & imaging results that were available during my care of the patient were reviewed by me and considered in my medical decision making (see chart for details).  33 year old known HIV, compliant medications, last CD4 count 249 followed by Dr. Tommy Medal presents for evaluation of Covid-like symptoms.  Patient states last week he had a cough which has since resolved.  Has had some mild clear rhinorrhea as well as some mild body aches and pains which have significantly improved since symptom onset.  He is afebrile, nonseptic, non-ill-appearing.  No chest pain, shortness of breath.  Tolerating p.o. intake at home.  No neck stiffness or neck rigidity.  No meningismus.  No rashes or lesions.  Known Covid exposure and cousin at a funeral  last week.  Heart and lungs clear.  Abdomen soft.  He has no tachycardia, tachypnea or hypoxia.  He is ambulatory with oxygen saturations greater than 98% on room air.  Chest x-ray without infiltrates, cardiomegaly, pulmonary edema, pneumothorax.  Will do outpatient Covid test.  Discussed symptomatic management as well as strict return precautions.  According to ID office even trying to contact patient for his normal follow-up appointment.  I discussed this with patient and he knows to call them to schedule a follow-up for his regular scheduled visit.  Patient voiced understanding of strict return precautions and is agreeable follow-up.  The patient has been appropriately medically screened and/or stabilized in the ED. I have low suspicion for any other emergent medical condition which would require further screening, evaluation or treatment in the ED or require inpatient management.  Patient  is hemodynamically stable and in no acute distress.  Patient able to ambulate in department prior to ED.  Evaluation does not show acute pathology that would require ongoing or additional emergent interventions while in the emergency department or further inpatient treatment.  I have discussed the diagnosis with the patient and answered all questions.  Pain is been managed while in the emergency department and patient has no further complaints prior to discharge.  Patient is comfortable with plan discussed in room and is stable for discharge at this time.  I have discussed strict return precautions for returning to the emergency department.  Patient was encouraged to follow-up with PCP/specialist refer to at discharge.    MDM Rules/Calculators/A&P                      Luis Vincent was evaluated in Emergency Department on 12/09/2019 for the symptoms described in the history of present illness. He was evaluated in the context of the global COVID-19 pandemic, which necessitated consideration that the patient might be at risk for infection with the SARS-CoV-2 virus that causes COVID-19. Institutional protocols and algorithms that pertain to the evaluation of patients at risk for COVID-19 are in a state of rapid change based on information released by regulatory bodies including the CDC and federal and state organizations. These policies and algorithms were followed during the patient's care in the ED. Final Clinical Impression(s) / ED Diagnoses Final diagnoses:  Cough  Exposure to COVID-19 virus    Rx / DC Orders ED Discharge Orders         Ordered    benzonatate (TESSALON) 100 MG capsule  Every 8 hours     12/09/19 1012    fluticasone (FLONASE) 50 MCG/ACT nasal spray  Daily     12/09/19 1012           Kyden Potash A, PA-C 12/09/19 1017    Blanchie Dessert, MD 12/10/19 1942

## 2019-12-14 ENCOUNTER — Telehealth: Payer: Self-pay | Admitting: *Deleted

## 2019-12-14 NOTE — Telephone Encounter (Signed)
Patient notified of + COVID test result. Questioned about symptoms, instructed in cleansing and quarantine and isolation practices, and dates with criteria to end each. Instructed when to seek medical care at Urgent Care/ER, masking if leave isolation. Educated on why re-testing is not necessary. Pt to notify PCP incase further follow up needed and informed that the [facility] Department would be notified. Questions answered, pt verbalized understanding of instructions.

## 2020-01-11 ENCOUNTER — Other Ambulatory Visit: Payer: Self-pay

## 2020-01-11 ENCOUNTER — Ambulatory Visit: Payer: Self-pay

## 2020-01-11 DIAGNOSIS — B20 Human immunodeficiency virus [HIV] disease: Secondary | ICD-10-CM

## 2020-01-11 DIAGNOSIS — Z113 Encounter for screening for infections with a predominantly sexual mode of transmission: Secondary | ICD-10-CM

## 2020-01-11 DIAGNOSIS — Z79899 Other long term (current) drug therapy: Secondary | ICD-10-CM

## 2020-01-22 IMAGING — CT CT ABD-PELV W/ CM
2 of 4 series · 16 of 46 positions shown, 18 images · IV contrast (omnipaque)
Comparison: None.

CLINICAL DATA: Enlarging mass on the medial aspect of the right
abdomen for 3 weeks.

EXAM:
CT ABDOMEN AND PELVIS WITH CONTRAST
TECHNIQUE: Multidetector CT imaging of the abdomen and pelvis was performed
using the standard protocol following bolus administration of
intravenous contrast.
CONTRAST:  100 mL OMNIPAQUE IOHEXOL 300 MG/ML  SOLN

[Series 3: a/p w/ 5mm · axial · 0.92mm/px · z∈[+846,+1292]mm · 13 of 99 slices shown, 15 images]
[im 5/99  soft-tissue]
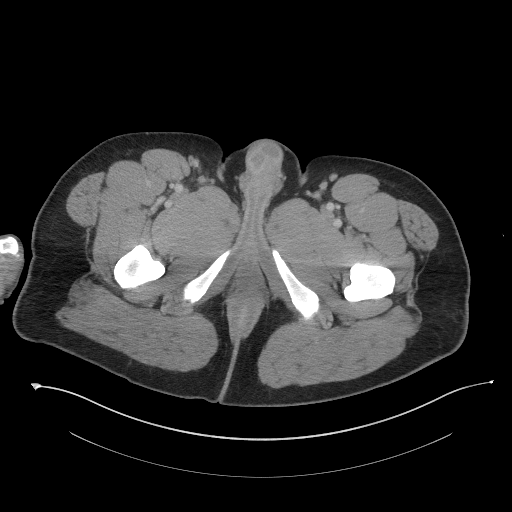
[im 5/99  bone]
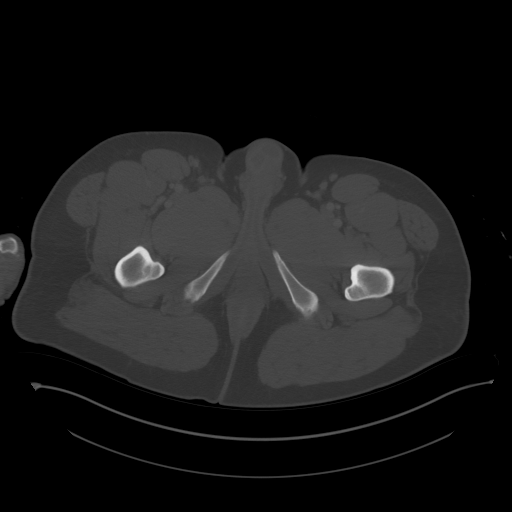
[im 13/99  soft-tissue]
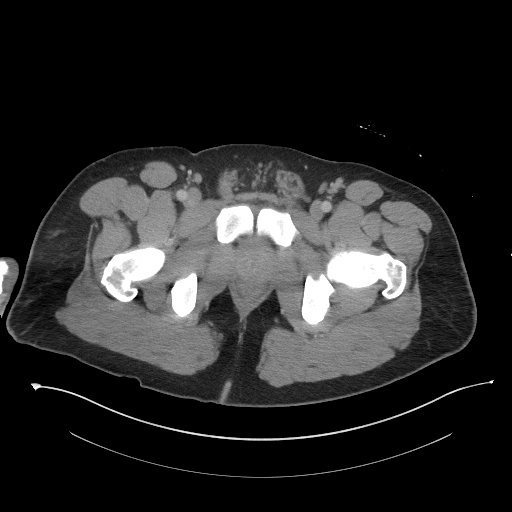
[im 22/99  soft-tissue]
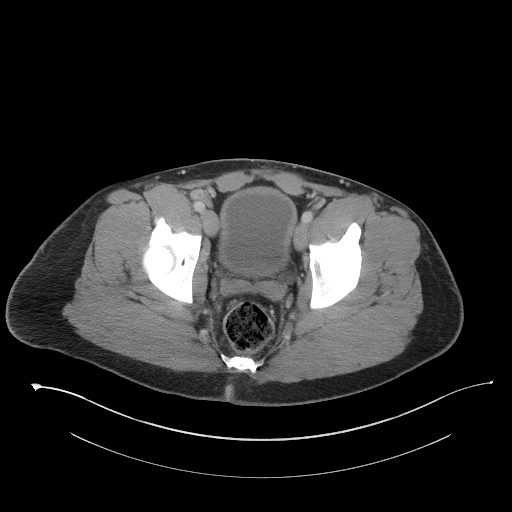
[im 26/99  soft-tissue]
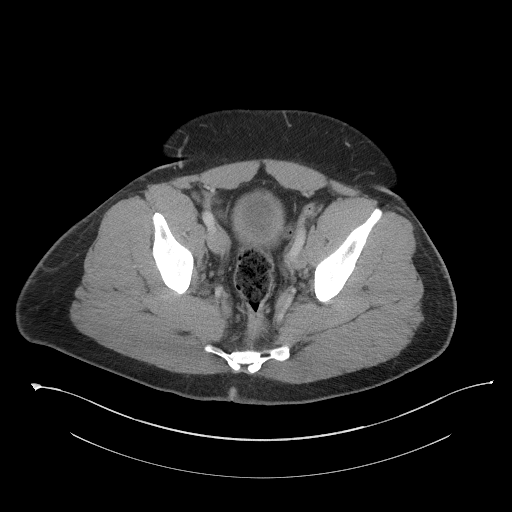
[im 35/99  soft-tissue]
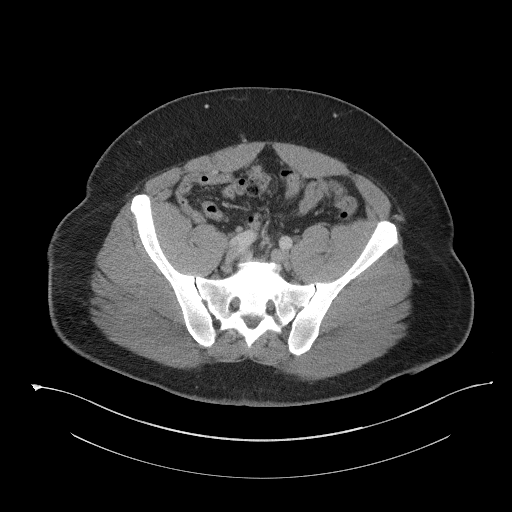
[im 43/99  soft-tissue]
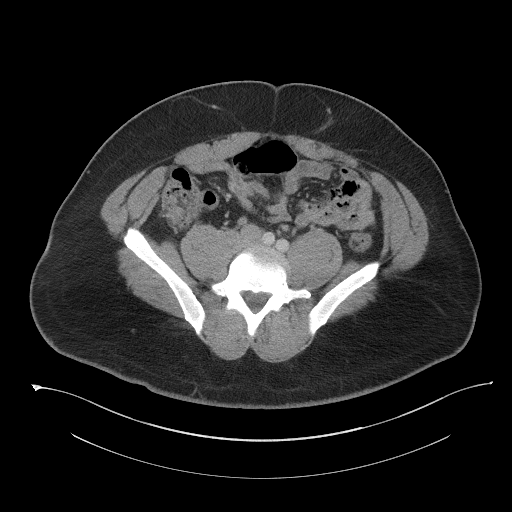
[im 52/99  soft-tissue]
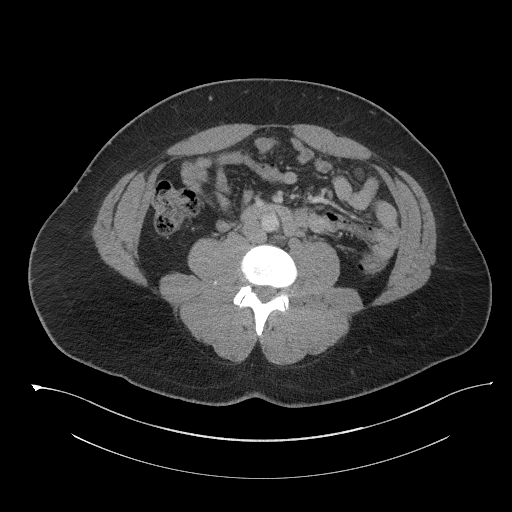
[im 56/99  soft-tissue]
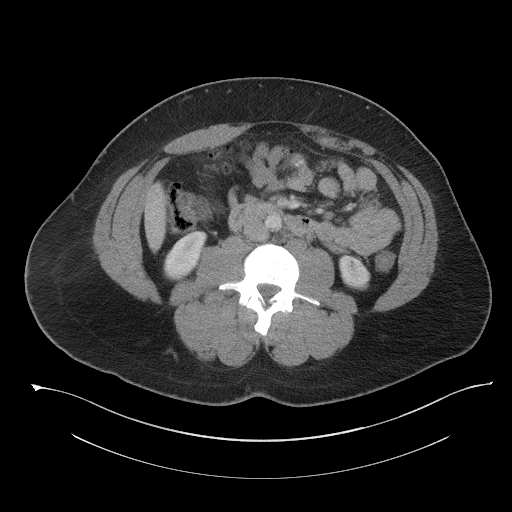
[im 64/99  soft-tissue]
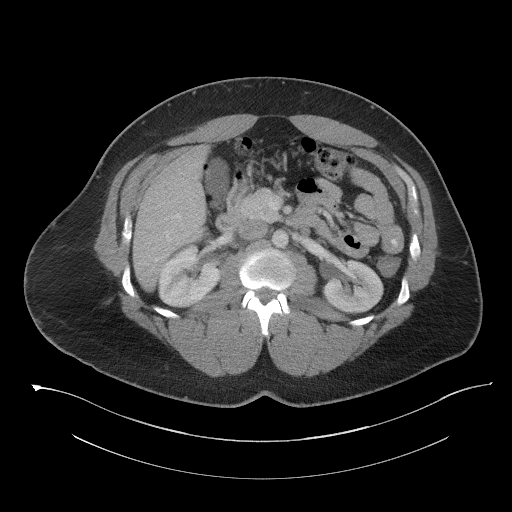
[im 64/99  bone]
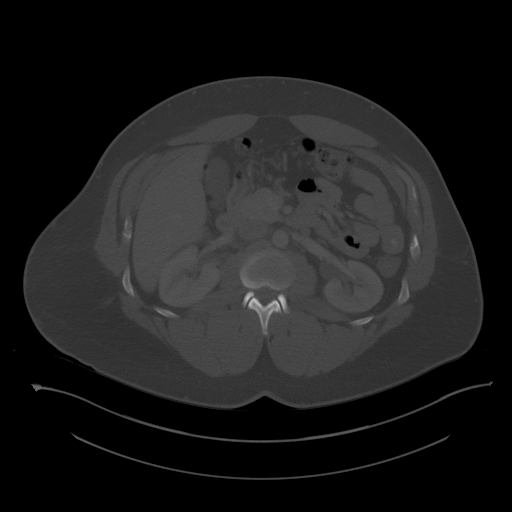
[im 73/99  soft-tissue]
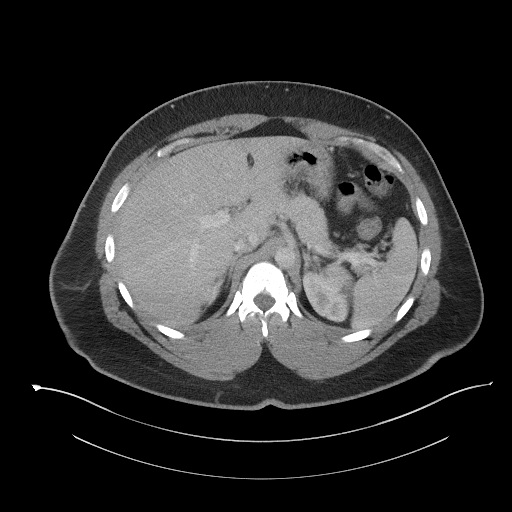
[im 77/99  soft-tissue]
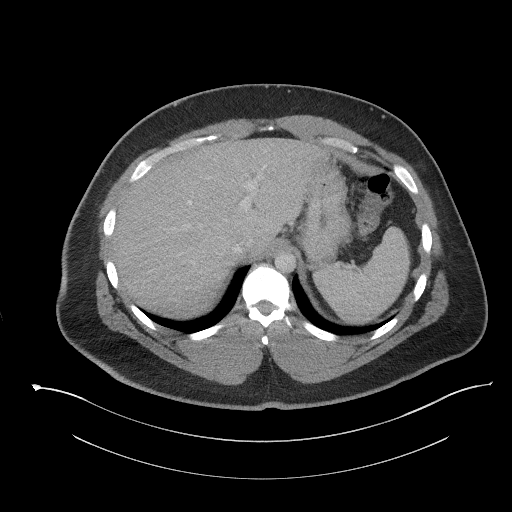
[im 86/99  soft-tissue]
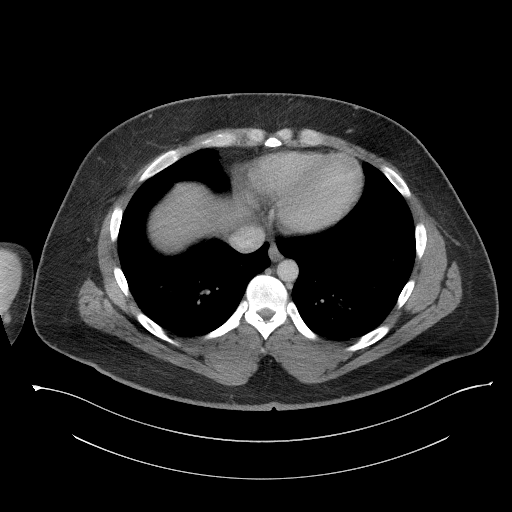
[im 94/99  soft-tissue]
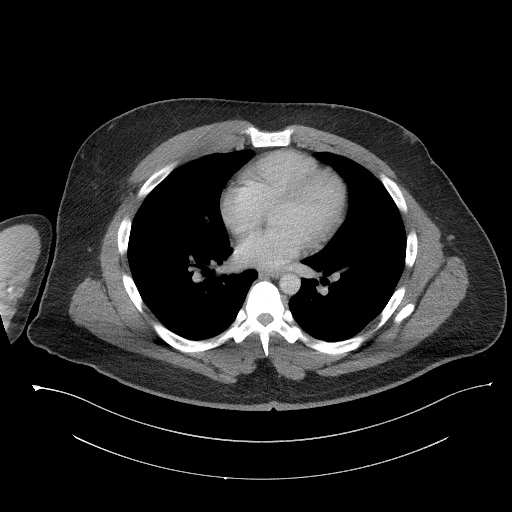

[Series 6: a/p w/ cor · coronal · 0.76mm/px · 3 of 151 slices shown]
[im 51/151  soft-tissue]
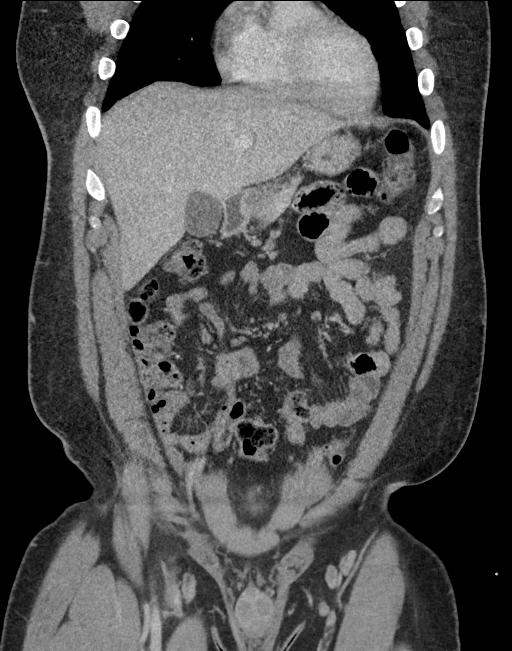
[im 67/151  soft-tissue]
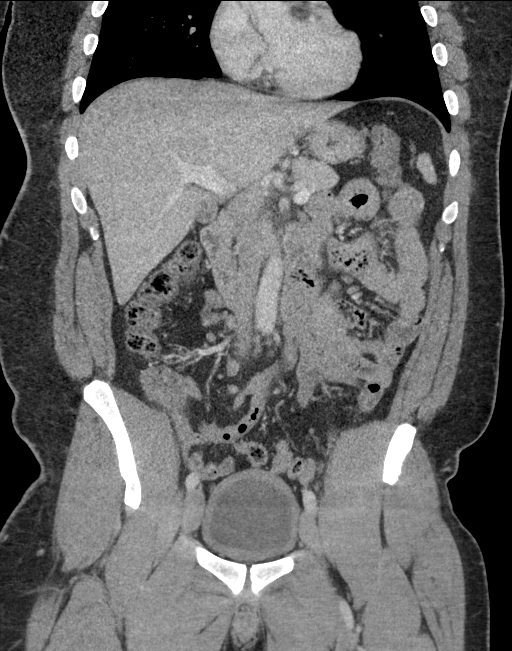
[im 84/151  soft-tissue]
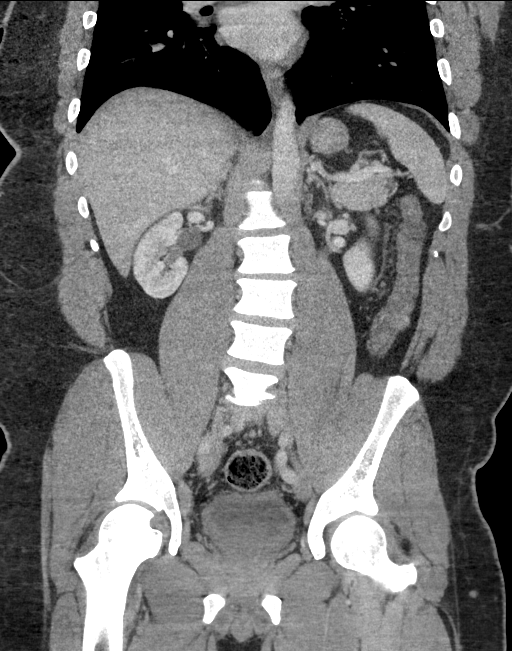

[16 of 46 positions shown; findings below may reference images not displayed]

FINDINGS: Lower chest: Lung bases are clear. No pleural or pericardial
effusion.

Hepatobiliary: No focal liver abnormality is seen. No gallstones,
gallbladder wall thickening, or biliary dilatation.

Pancreas: Unremarkable. No pancreatic ductal dilatation or
surrounding inflammatory changes.

Spleen: Normal in size without focal abnormality.

Adrenals/Urinary Tract: No adrenal hemorrhage or renal injury
identified. Walls of the urinary bladder are thickened although the
bladder is incompletely distended.

Stomach/Bowel: Stomach is within normal limits. Appendix appears
normal. No evidence of bowel wall thickening, distention, or
inflammatory changes.

Vascular/Lymphatic: No significant vascular findings are present. No
enlarged abdominal or pelvic lymph nodes.

Reproductive: Prostate is unremarkable.

Other: In the subcutaneous fatty tissues along the lateral aspect of
the right abdomen, there is a lesion measuring 7.2 cm AP x 7.3 cm
transverse x 7 cm craniocaudal. The lesion has a thin capsule. It
demonstrates homogeneous fat attenuation throughout without a soft
tissue component or septation.

Musculoskeletal: No acute or focal abnormality. Convex left
scoliosis and degenerative disc disease L5-S1 noted.
IMPRESSION: 1. 7.2 x 7.3 x 7 cm in the subcutaneous fatty tissues along the
lateral aspect of the right abdomen has an appearance consistent
with a simple lipoma.
2. Walls of the urinary bladder are thickened but the bladder is
incompletely distended. Recommend correlation with urinalysis if
indicated.
3. Convex left scoliosis and degenerative disc disease L5-S1.

## 2020-02-03 ENCOUNTER — Encounter: Payer: Self-pay | Admitting: Infectious Disease

## 2020-02-14 ENCOUNTER — Ambulatory Visit: Payer: Self-pay

## 2020-02-14 ENCOUNTER — Other Ambulatory Visit: Payer: Self-pay

## 2020-02-27 IMAGING — DX DG CHEST 1V PORT
1 series · 1 of 1 positions shown · non-contrast
Comparison: Chest x-ray 09/14/2018.

CLINICAL DATA: 33-year-old male with history of 2 days of body
aches and cough.

EXAM:
PORTABLE CHEST 1 VIEW

[chest ap]
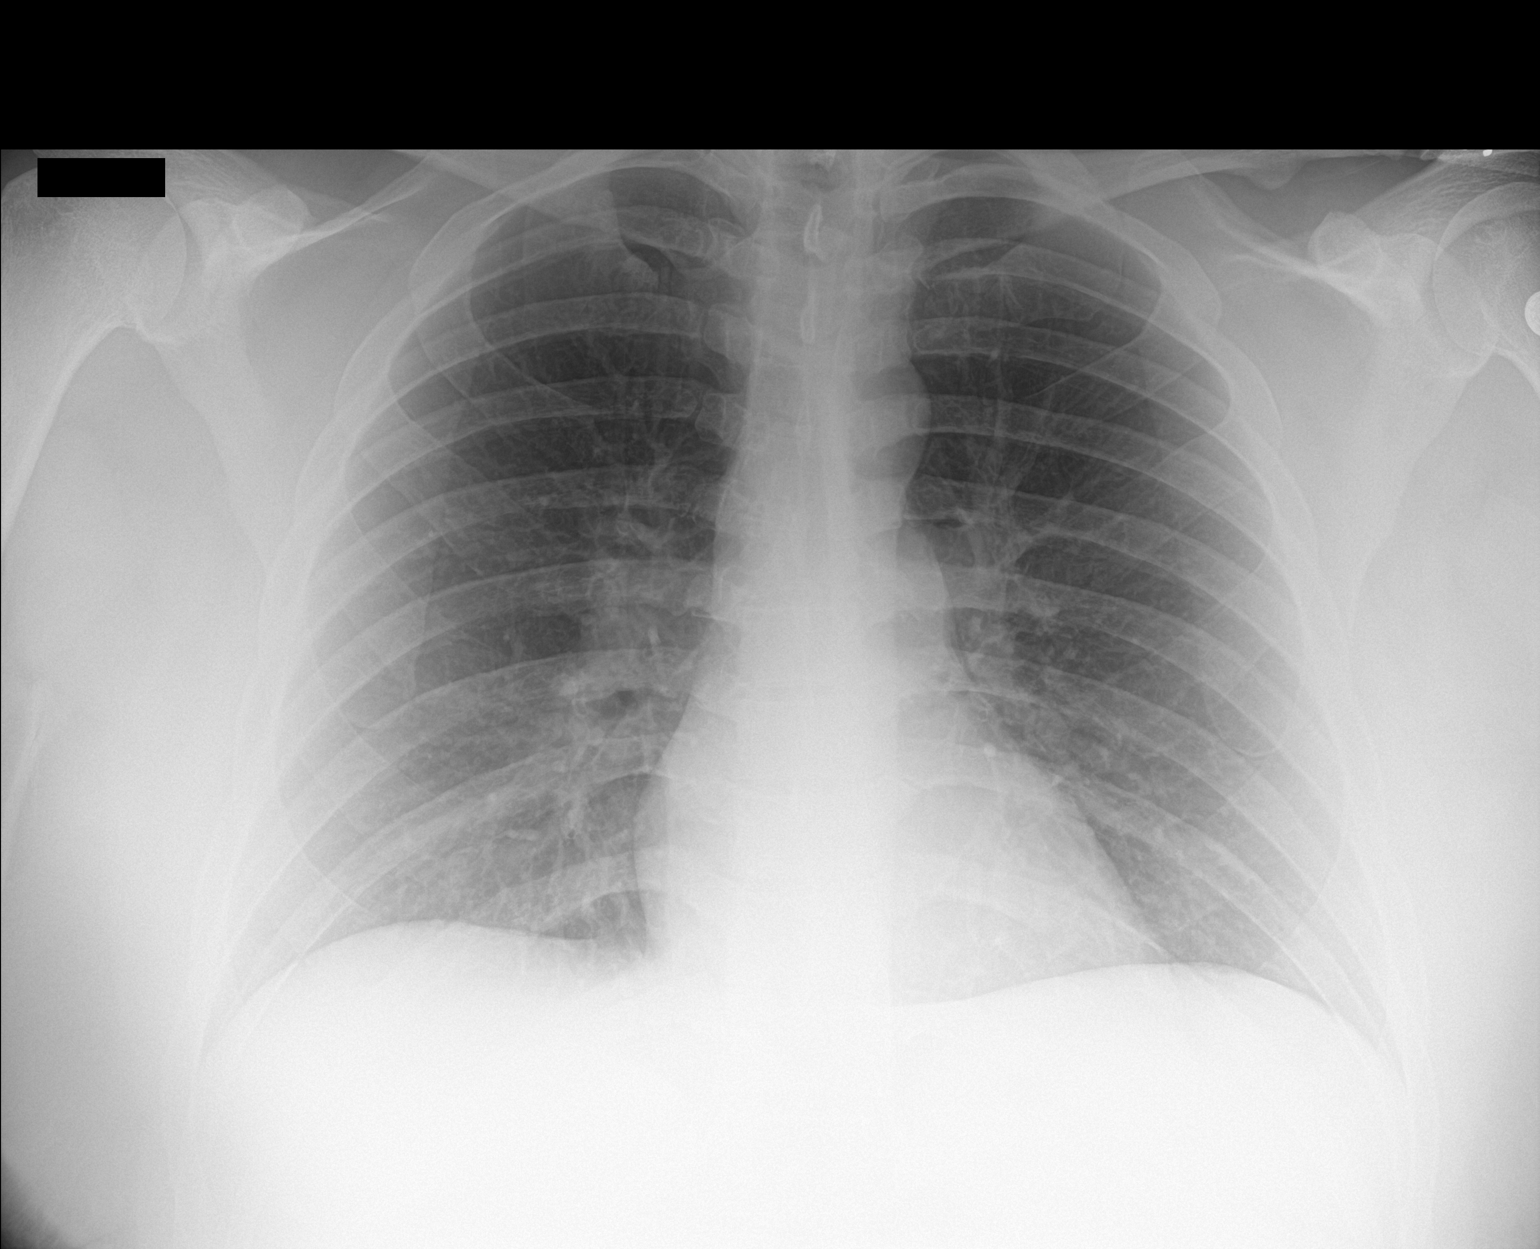

[1 of 1 positions shown; findings below may reference images not displayed]

FINDINGS: Lung volumes are normal. No consolidative airspace disease. No
pleural effusions. No pneumothorax. No pulmonary nodule or mass
noted. Pulmonary vasculature and the cardiomediastinal silhouette
are within normal limits.
IMPRESSION: No radiographic evidence of acute cardiopulmonary disease.

## 2020-03-09 ENCOUNTER — Telehealth: Payer: Self-pay | Admitting: *Deleted

## 2020-03-09 ENCOUNTER — Ambulatory Visit: Payer: Self-pay | Admitting: Infectious Disease

## 2020-03-09 NOTE — Telephone Encounter (Signed)
RN attempted to call patient to offer telephone visit or reschedule today's missed visit. Voicemail is full. Landis Gandy, RN

## 2020-03-13 ENCOUNTER — Other Ambulatory Visit: Payer: Self-pay

## 2020-03-13 ENCOUNTER — Encounter: Payer: Self-pay | Admitting: Infectious Disease

## 2020-03-13 ENCOUNTER — Ambulatory Visit: Payer: Self-pay

## 2020-03-13 DIAGNOSIS — Z113 Encounter for screening for infections with a predominantly sexual mode of transmission: Secondary | ICD-10-CM

## 2020-03-13 DIAGNOSIS — B20 Human immunodeficiency virus [HIV] disease: Secondary | ICD-10-CM

## 2020-03-13 DIAGNOSIS — Z79899 Other long term (current) drug therapy: Secondary | ICD-10-CM

## 2020-03-14 LAB — URINE CYTOLOGY ANCILLARY ONLY
Chlamydia: NEGATIVE
Comment: NEGATIVE
Comment: NORMAL
Neisseria Gonorrhea: NEGATIVE

## 2020-03-14 LAB — T-HELPER CELL (CD4) - (RCID CLINIC ONLY)
CD4 % Helper T Cell: 6 % — ABNORMAL LOW (ref 33–65)
CD4 T Cell Abs: 149 /uL — ABNORMAL LOW (ref 400–1790)

## 2020-03-15 LAB — CBC WITH DIFFERENTIAL/PLATELET
Absolute Monocytes: 474 cells/uL (ref 200–950)
Basophils Absolute: 41 cells/uL (ref 0–200)
Basophils Relative: 0.8 %
Eosinophils Absolute: 173 cells/uL (ref 15–500)
Eosinophils Relative: 3.4 %
HCT: 45.3 % (ref 38.5–50.0)
Hemoglobin: 14.7 g/dL (ref 13.2–17.1)
Lymphs Abs: 2637 cells/uL (ref 850–3900)
MCH: 29.1 pg (ref 27.0–33.0)
MCHC: 32.5 g/dL (ref 32.0–36.0)
MCV: 89.7 fL (ref 80.0–100.0)
MPV: 9.9 fL (ref 7.5–12.5)
Monocytes Relative: 9.3 %
Neutro Abs: 1775 cells/uL (ref 1500–7800)
Neutrophils Relative %: 34.8 %
Platelets: 271 10*3/uL (ref 140–400)
RBC: 5.05 10*6/uL (ref 4.20–5.80)
RDW: 13.4 % (ref 11.0–15.0)
Total Lymphocyte: 51.7 %
WBC: 5.1 10*3/uL (ref 3.8–10.8)

## 2020-03-15 LAB — LIPID PANEL
Cholesterol: 231 mg/dL — ABNORMAL HIGH (ref <200)
HDL: 34 mg/dL — ABNORMAL LOW (ref >=40)
LDL Cholesterol (Calc): 173 mg/dL (calc) — ABNORMAL HIGH
Non-HDL Cholesterol (Calc): 197 mg/dL (calc) — ABNORMAL HIGH (ref <130)
Total CHOL/HDL Ratio: 6.8 (calc) — ABNORMAL HIGH (ref <5.0)
Triglycerides: 114 mg/dL (ref <150)

## 2020-03-15 LAB — COMPLETE METABOLIC PANEL WITH GFR
AG Ratio: 1.3 (calc) (ref 1.0–2.5)
ALT: 45 U/L (ref 9–46)
AST: 29 U/L (ref 10–40)
Albumin: 4.2 g/dL (ref 3.6–5.1)
Alkaline phosphatase (APISO): 75 U/L (ref 36–130)
BUN: 13 mg/dL (ref 7–25)
CO2: 29 mmol/L (ref 20–32)
Calcium: 9.6 mg/dL (ref 8.6–10.3)
Chloride: 104 mmol/L (ref 98–110)
Creat: 1.27 mg/dL (ref 0.60–1.35)
GFR, Est African American: 85 mL/min/{1.73_m2} (ref >=60)
GFR, Est Non African American: 73 mL/min/{1.73_m2} (ref >=60)
Globulin: 3.2 g/dL (calc) (ref 1.9–3.7)
Glucose, Bld: 75 mg/dL (ref 65–99)
Potassium: 4.1 mmol/L (ref 3.5–5.3)
Sodium: 139 mmol/L (ref 135–146)
Total Bilirubin: 0.6 mg/dL (ref 0.2–1.2)
Total Protein: 7.4 g/dL (ref 6.1–8.1)

## 2020-03-15 LAB — RPR: RPR Ser Ql: NONREACTIVE

## 2020-03-15 LAB — HIV-1 RNA QUANT-NO REFLEX-BLD
HIV 1 RNA Quant: 334000 copies/mL — ABNORMAL HIGH
HIV-1 RNA Quant, Log: 5.52 Log copies/mL — ABNORMAL HIGH

## 2020-03-16 ENCOUNTER — Other Ambulatory Visit: Payer: Self-pay | Admitting: Infectious Disease

## 2020-03-16 DIAGNOSIS — B20 Human immunodeficiency virus [HIV] disease: Secondary | ICD-10-CM

## 2020-03-17 ENCOUNTER — Telehealth: Payer: Self-pay | Admitting: *Deleted

## 2020-03-17 NOTE — Telephone Encounter (Addendum)
Relayed to Lab. Landis Gandy, RN   ----- Message from Truman Hayward, MD sent at 03/16/2020  9:53 AM EDT ----- Will add genotype if Santiago Glad can run one

## 2020-03-25 LAB — HIV-1 GENOTYPING (RTI,PI,IN INHBTR): HIV-1 Genotype: DETECTED — AB

## 2020-03-29 ENCOUNTER — Telehealth: Payer: Self-pay | Admitting: *Deleted

## 2020-03-29 ENCOUNTER — Ambulatory Visit: Payer: Self-pay | Admitting: Infectious Disease

## 2020-03-29 NOTE — Telephone Encounter (Signed)
RN left message asking patient to reschedule today's missed visit.  He has no-showed Dr Tommy Medal several times in a row, needs to schedule with pharmacy. Landis Gandy, RN

## 2020-05-29 ENCOUNTER — Encounter: Payer: Self-pay | Admitting: Infectious Disease

## 2020-05-29 ENCOUNTER — Ambulatory Visit (INDEPENDENT_AMBULATORY_CARE_PROVIDER_SITE_OTHER): Payer: Self-pay | Admitting: Infectious Disease

## 2020-05-29 ENCOUNTER — Other Ambulatory Visit: Payer: Self-pay

## 2020-05-29 VITALS — BP 149/82 | HR 72 | Temp 98.3°F | Wt 257.8 lb

## 2020-05-29 DIAGNOSIS — A5149 Other secondary syphilitic conditions: Secondary | ICD-10-CM

## 2020-05-29 DIAGNOSIS — B20 Human immunodeficiency virus [HIV] disease: Secondary | ICD-10-CM

## 2020-05-29 MED ORDER — BIKTARVY 50-200-25 MG PO TABS: 1.0000 | ORAL_TABLET | Freq: Every day | ORAL | 5 refills | Status: DC

## 2020-05-29 NOTE — Progress Notes (Signed)
Subjective:  Chief followup for  HIV disease   Patient ID: Luis Vincent, male    DOB: 1986/09/05, 34 y.o.   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   He apparently lost insurance and did not come in and get his HIV medication assistance program engaged until recently.   He was w VL over 334K in April  His CD4 had dropped below 200.       Past Medical History:  Diagnosis Date  . Headache   . HIV infection ([place])   . Immune deficiency disorder ([place])   . Syphilis     No past surgical history on file.  No family history on file.    Social History   Socioeconomic History  . Marital status: Single    Spouse name: Not on file  . Number of children: Not on file  . Years of education: Not on file  . Highest education level: Not on file  Occupational History  . Not on file  Tobacco Use  . Smoking status: Never Smoker  . Smokeless tobacco: Never Used  Substance and Sexual Activity  . Alcohol use: No  . Drug use: No  . Sexual activity: Not Currently    Comment: condoms available  Other Topics Concern  . Not on file  Social History Narrative  . Not on file   Social Determinants of Health   Financial Resource Strain:   . Difficulty of Paying Living Expenses:   Food Insecurity:   . Worried About Charity fundraiser in the Last Year:   . Arboriculturist in the Last Year:   Transportation Needs:   . Film/video editor (Medical):   Marland Kitchen Lack of Transportation (Non-Medical):   Physical Activity:   . Days of Exercise per Week:   . Minutes of Exercise per Session:   Stress:   . Feeling of Stress :   Social Connections:   . Frequency of Communication with Friends and Family:   . Frequency of Social Gatherings with Friends and  Family:   . Attends Religious Services:   . Active Member of Clubs or Organizations:   . Attends Archivist Meetings:   Marland Kitchen Marital Status:     No Known Allergies   Current Outpatient Medications:  .  bictegravir-emtricitabine-tenofovir AF (BIKTARVY) 50-200-25 MG TABS tablet, Take 1 tablet by mouth daily., Disp: 30 tablet, Rfl: 5 .  benzonatate (TESSALON) 100 MG capsule, Take 1 capsule (100 mg total) by mouth every 8 (eight) hours. (Patient not taking: Reported on 05/29/2020), Disp: 21 capsule, Rfl: 0 .  fluticasone (FLONASE) 50 MCG/ACT nasal spray, Place 1 spray into both nostrils daily. (Patient not taking: Reported on 05/29/2020), Disp: 11.1 mL, Rfl: 0    Review of Systems  Constitutional: Negative for activity change, appetite change, chills, diaphoresis, fatigue, fever and unexpected weight change.  HENT: Negative for congestion, rhinorrhea, sinus pressure, sneezing, sore throat and trouble swallowing.   Eyes: Negative for photophobia and visual disturbance.  Respiratory: Negative for cough, chest tightness, shortness of breath, wheezing and stridor.   Cardiovascular: Negative for chest pain, palpitations and leg swelling.  Gastrointestinal: Negative for abdominal distention, abdominal pain, anal bleeding, blood in stool, constipation, diarrhea, nausea and vomiting.  Genitourinary: Negative for difficulty urinating, dysuria, flank pain  and hematuria.  Musculoskeletal: Negative for arthralgias, back pain, gait problem, joint swelling and myalgias.  Skin: Negative for color change, pallor and wound.  Neurological: Negative for dizziness, tremors, weakness and light-headedness.  Hematological: Negative for adenopathy. Does not bruise/bleed easily.  Psychiatric/Behavioral: Negative for agitation, behavioral problems, confusion, decreased concentration, dysphoric mood and sleep disturbance.       Objective:   Physical Exam Constitutional:      Appearance: He is  well-developed.  HENT:     Head: Normocephalic and atraumatic.  Eyes:     Conjunctiva/sclera: Conjunctivae normal.  Cardiovascular:     Rate and Rhythm: Normal rate and regular rhythm.  Pulmonary:     Effort: Pulmonary effort is normal. No respiratory distress.     Breath sounds: No wheezing.  Abdominal:     General: There is no distension.     Palpations: Abdomen is soft.  Musculoskeletal:        General: No tenderness. Normal range of motion.     Cervical back: Normal range of motion and neck supple.  Skin:    General: Skin is warm and dry.     Findings: No erythema or rash.  Neurological:     Mental Status: He is alert and oriented to person, place, and time.  Psychiatric:        Behavior: Behavior normal.        Thought Content: Thought content normal.        Judgment: Judgment normal.          Assessment & Plan:   HIV: check labs today. If cd4 still <200 start Bactrim for PJP prophylaxis  Syphilis titers: NON-REACTIVE (04/05 0851) sp treatment

## 2020-05-30 LAB — URINE CYTOLOGY ANCILLARY ONLY
Chlamydia: NEGATIVE
Comment: NEGATIVE
Comment: NORMAL
Neisseria Gonorrhea: NEGATIVE

## 2020-05-30 LAB — T-HELPER CELL (CD4) - (RCID CLINIC ONLY)
CD4 % Helper T Cell: 11 % — ABNORMAL LOW (ref 33–65)
CD4 T Cell Abs: 224 /uL — ABNORMAL LOW (ref 400–1790)

## 2020-05-31 ENCOUNTER — Encounter: Payer: Self-pay | Admitting: Infectious Disease

## 2020-05-31 LAB — CBC WITH DIFFERENTIAL/PLATELET
Absolute Monocytes: 413 cells/uL (ref 200–950)
Basophils Absolute: 60 cells/uL (ref 0–200)
Basophils Relative: 1.4 %
Eosinophils Absolute: 112 cells/uL (ref 15–500)
Eosinophils Relative: 2.6 %
HCT: 45.6 % (ref 38.5–50.0)
Hemoglobin: 15.2 g/dL (ref 13.2–17.1)
Lymphs Abs: 2253 cells/uL (ref 850–3900)
MCH: 29.9 pg (ref 27.0–33.0)
MCHC: 33.3 g/dL (ref 32.0–36.0)
MCV: 89.8 fL (ref 80.0–100.0)
MPV: 9.9 fL (ref 7.5–12.5)
Monocytes Relative: 9.6 %
Neutro Abs: 1462 cells/uL — ABNORMAL LOW (ref 1500–7800)
Neutrophils Relative %: 34 %
Platelets: 273 10*3/uL (ref 140–400)
RBC: 5.08 10*6/uL (ref 4.20–5.80)
RDW: 13.1 % (ref 11.0–15.0)
Total Lymphocyte: 52.4 %
WBC: 4.3 10*3/uL (ref 3.8–10.8)

## 2020-05-31 LAB — COMPLETE METABOLIC PANEL WITH GFR
AG Ratio: 1.5 (calc) (ref 1.0–2.5)
ALT: 30 U/L (ref 9–46)
AST: 26 U/L (ref 10–40)
Albumin: 4.3 g/dL (ref 3.6–5.1)
Alkaline phosphatase (APISO): 55 U/L (ref 36–130)
BUN/Creatinine Ratio: 10 (calc) (ref 6–22)
BUN: 15 mg/dL (ref 7–25)
CO2: 24 mmol/L (ref 20–32)
Calcium: 9.8 mg/dL (ref 8.6–10.3)
Chloride: 104 mmol/L (ref 98–110)
Creat: 1.52 mg/dL — ABNORMAL HIGH (ref 0.60–1.35)
GFR, Est African American: 68 mL/min/{1.73_m2} (ref >=60)
GFR, Est Non African American: 59 mL/min/{1.73_m2} — ABNORMAL LOW (ref >=60)
Globulin: 2.9 g/dL (calc) (ref 1.9–3.7)
Glucose, Bld: 83 mg/dL (ref 65–99)
Potassium: 4.4 mmol/L (ref 3.5–5.3)
Sodium: 139 mmol/L (ref 135–146)
Total Bilirubin: 1 mg/dL (ref 0.2–1.2)
Total Protein: 7.2 g/dL (ref 6.1–8.1)

## 2020-05-31 LAB — HIV-1 RNA QUANT-NO REFLEX-BLD
HIV 1 RNA Quant: 1950 copies/mL — ABNORMAL HIGH
HIV-1 RNA Quant, Log: 3.29 Log copies/mL — ABNORMAL HIGH

## 2020-05-31 LAB — RPR: RPR Ser Ql: NONREACTIVE

## 2020-06-26 ENCOUNTER — Ambulatory Visit: Payer: Self-pay

## 2020-07-10 ENCOUNTER — Ambulatory Visit: Payer: Self-pay

## 2020-07-10 ENCOUNTER — Encounter: Payer: Self-pay | Admitting: Infectious Disease

## 2020-07-10 ENCOUNTER — Other Ambulatory Visit: Payer: Self-pay

## 2020-07-31 ENCOUNTER — Ambulatory Visit: Payer: Self-pay | Admitting: Infectious Disease

## 2020-08-07 ENCOUNTER — Ambulatory Visit (INDEPENDENT_AMBULATORY_CARE_PROVIDER_SITE_OTHER): Payer: Self-pay | Admitting: Infectious Disease

## 2020-08-07 ENCOUNTER — Other Ambulatory Visit: Payer: Self-pay | Admitting: Infectious Disease

## 2020-08-07 ENCOUNTER — Encounter: Payer: Self-pay | Admitting: Infectious Disease

## 2020-08-07 ENCOUNTER — Other Ambulatory Visit: Payer: Self-pay

## 2020-08-07 VITALS — BP 145/82 | HR 60 | Temp 97.5°F | Wt 264.0 lb

## 2020-08-07 DIAGNOSIS — A5149 Other secondary syphilitic conditions: Secondary | ICD-10-CM

## 2020-08-07 DIAGNOSIS — B20 Human immunodeficiency virus [HIV] disease: Secondary | ICD-10-CM

## 2020-08-07 MED ORDER — BIKTARVY 50-200-25 MG PO TABS: 1.0000 | ORAL_TABLET | Freq: Every day | ORAL | 5 refills | Status: DC

## 2020-08-07 NOTE — Progress Notes (Signed)
Subjective:  Chief followup for  HIV disease   Patient ID: Luis Vincent, male    DOB: 10-04-1986, 34 y.o.   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   He apparently lost insurance and did not come in and get his HIV medication assistance program engaged until recently.   He was w VL over 334K in April  His CD4 had dropped below 200.  I gave him several bottles of Biktarvy and he got back on the HIV medication assistance plan.  His most recent viral load was down in thousands.  He has been vaccinated against COVID-19.       Past Medical History:  Diagnosis Date  . Headache   . HIV infection ([place])   . Immune deficiency disorder ([place])   . Syphilis     No past surgical history on file.  No family history on file.    Social History   Socioeconomic History  . Marital status: Single    Spouse name: Not on file  . Number of children: Not on file  . Years of education: Not on file  . Highest education level: Not on file  Occupational History  . Not on file  Tobacco Use  . Smoking status: Never Smoker  . Smokeless tobacco: Never Used  Substance and Sexual Activity  . Alcohol use: No  . Drug use: No  . Sexual activity: Not Currently    Comment: condoms declined  Other Topics Concern  . Not on file  Social History Narrative  . Not on file   Social Determinants of Health   Financial Resource Strain:   . Difficulty of Paying Living Expenses: Not on file  Food Insecurity:   . Worried About Charity fundraiser in the Last Year: Not on file  . Ran Out of Food in the Last Year: Not on file  Transportation Needs:   . Lack of Transportation (Medical): Not on file  . Lack of Transportation (Non-Medical): Not on file  Physical  Activity:   . Days of Exercise per Week: Not on file  . Minutes of Exercise per Session: Not on file  Stress:   . Feeling of Stress : Not on file  Social Connections:   . Frequency of Communication with Friends and Family: Not on file  . Frequency of Social Gatherings with Friends and Family: Not on file  . Attends Religious Services: Not on file  . Active Member of Clubs or Organizations: Not on file  . Attends Archivist Meetings: Not on file  . Marital Status: Not on file    No Known Allergies   Current Outpatient Medications:  .  benzonatate (TESSALON) 100 MG capsule, Take 1 capsule (100 mg total) by mouth every 8 (eight) hours. (Patient not taking: Reported on 05/29/2020), Disp: 21 capsule, Rfl: 0 .  bictegravir-emtricitabine-tenofovir AF (BIKTARVY) 50-200-25 MG TABS tablet, Take 1 tablet by mouth daily., Disp: 30 tablet, Rfl: 5 .  fluticasone (FLONASE) 50 MCG/ACT nasal spray, Place 1 spray into both nostrils daily. (Patient not taking: Reported on 05/29/2020), Disp: 11.1 mL, Rfl: 0    Review of Systems  Constitutional: Negative for activity change, appetite change, chills, diaphoresis, fatigue, fever and unexpected weight change.  HENT: Negative for congestion, rhinorrhea, sinus pressure, sneezing, sore throat  and trouble swallowing.   Eyes: Negative for photophobia and visual disturbance.  Respiratory: Negative for cough, chest tightness, shortness of breath, wheezing and stridor.   Cardiovascular: Negative for chest pain, palpitations and leg swelling.  Gastrointestinal: Negative for abdominal distention, abdominal pain, anal bleeding, blood in stool, constipation, diarrhea, nausea and vomiting.  Genitourinary: Negative for difficulty urinating, dysuria, flank pain and hematuria.  Musculoskeletal: Negative for arthralgias, back pain, gait problem, joint swelling and myalgias.  Skin: Negative for color change, pallor and wound.  Neurological: Negative for dizziness,  tremors, weakness and light-headedness.  Hematological: Negative for adenopathy. Does not bruise/bleed easily.  Psychiatric/Behavioral: Negative for agitation, behavioral problems, confusion, decreased concentration, dysphoric mood, self-injury and sleep disturbance.       Objective:   Physical Exam Constitutional:      Appearance: He is well-developed.  HENT:     Head: Normocephalic and atraumatic.  Eyes:     Conjunctiva/sclera: Conjunctivae normal.  Cardiovascular:     Rate and Rhythm: Normal rate and regular rhythm.  Pulmonary:     Effort: Pulmonary effort is normal. No respiratory distress.     Breath sounds: No wheezing.  Abdominal:     General: There is no distension.     Palpations: Abdomen is soft.  Musculoskeletal:        General: No tenderness. Normal range of motion.     Cervical back: Normal range of motion and neck supple.  Skin:    General: Skin is warm and dry.     Findings: No erythema or rash.  Neurological:     Mental Status: He is alert and oriented to person, place, and time.  Psychiatric:        Mood and Affect: Mood normal.        Behavior: Behavior normal.        Thought Content: Thought content normal.        Judgment: Judgment normal.          Assessment & Plan:   HIV: check labs today.  I explained the utmost importance of him taking his medications to keep his virus suppressed and keep his immune system healthy.  I also emphasized the fact that when his virus is out of control he is highly infectious to others whom he might have unprotected sexual intercourse with.  Hopefully can maintain virological suppression and keep his health and avoid transmission to others.  He denies having problems with substance abuse or depression or anxiety or stigma around taking his medications.    Syphilis titers: NON-REACTIVE (06/21 1103) sp treatment  Check for STIs today

## 2020-08-08 LAB — URINE CYTOLOGY ANCILLARY ONLY
Chlamydia: NEGATIVE
Comment: NEGATIVE
Comment: NORMAL
Neisseria Gonorrhea: NEGATIVE

## 2020-08-08 LAB — T-HELPER CELL (CD4) - (RCID CLINIC ONLY)
CD4 % Helper T Cell: 13 % — ABNORMAL LOW (ref 33–65)
CD4 T Cell Abs: 232 /uL — ABNORMAL LOW (ref 400–1790)

## 2020-08-10 LAB — CBC WITH DIFFERENTIAL/PLATELET
Absolute Monocytes: 432 cells/uL (ref 200–950)
Basophils Absolute: 42 cells/uL (ref 0–200)
Basophils Relative: 0.8 %
Eosinophils Absolute: 837 cells/uL — ABNORMAL HIGH (ref 15–500)
Eosinophils Relative: 16.1 %
HCT: 45.2 % (ref 38.5–50.0)
Hemoglobin: 15.2 g/dL (ref 13.2–17.1)
Lymphs Abs: 2189 cells/uL (ref 850–3900)
MCH: 29.7 pg (ref 27.0–33.0)
MCHC: 33.6 g/dL (ref 32.0–36.0)
MCV: 88.5 fL (ref 80.0–100.0)
MPV: 9.8 fL (ref 7.5–12.5)
Monocytes Relative: 8.3 %
Neutro Abs: 1700 cells/uL (ref 1500–7800)
Neutrophils Relative %: 32.7 %
Platelets: 275 10*3/uL (ref 140–400)
RBC: 5.11 10*6/uL (ref 4.20–5.80)
RDW: 13.1 % (ref 11.0–15.0)
Total Lymphocyte: 42.1 %
WBC: 5.2 10*3/uL (ref 3.8–10.8)

## 2020-08-10 LAB — COMPLETE METABOLIC PANEL WITH GFR
AG Ratio: 1.5 (calc) (ref 1.0–2.5)
ALT: 28 U/L (ref 9–46)
AST: 25 U/L (ref 10–40)
Albumin: 4.2 g/dL (ref 3.6–5.1)
Alkaline phosphatase (APISO): 58 U/L (ref 36–130)
BUN/Creatinine Ratio: 10 (calc) (ref 6–22)
BUN: 14 mg/dL (ref 7–25)
CO2: 27 mmol/L (ref 20–32)
Calcium: 9.7 mg/dL (ref 8.6–10.3)
Chloride: 104 mmol/L (ref 98–110)
Creat: 1.44 mg/dL — ABNORMAL HIGH (ref 0.60–1.35)
GFR, Est African American: 73 mL/min/{1.73_m2} (ref >=60)
GFR, Est Non African American: 63 mL/min/{1.73_m2} (ref >=60)
Globulin: 2.8 g/dL (calc) (ref 1.9–3.7)
Glucose, Bld: 90 mg/dL (ref 65–99)
Potassium: 4.1 mmol/L (ref 3.5–5.3)
Sodium: 137 mmol/L (ref 135–146)
Total Bilirubin: 0.8 mg/dL (ref 0.2–1.2)
Total Protein: 7 g/dL (ref 6.1–8.1)

## 2020-08-10 LAB — RPR: RPR Ser Ql: NONREACTIVE

## 2020-08-10 LAB — HIV-1 RNA QUANT-NO REFLEX-BLD
HIV 1 RNA Quant: 93600 Copies/mL — ABNORMAL HIGH
HIV-1 RNA Quant, Log: 4.97 Log cps/mL — ABNORMAL HIGH

## 2020-08-11 ENCOUNTER — Telehealth: Payer: Self-pay

## 2020-08-11 NOTE — Telephone Encounter (Signed)
-----   Message from Truman Hayward, MD sent at 08/11/2020  4:13 PM EDT ----- Regarding: VL high again ? Denise help w him? He might also be candidate for ACTG study   Regardless we should run R testing on him and he needs to be seen monthly by either myself, Cassie Np or partner until he can get consistently under control ----- Message ----- From: [place] Results In Sent: 08/07/2020   4:52 PM EDT To: Truman Hayward, MD

## 2020-08-11 NOTE — Telephone Encounter (Signed)
Spoke with patient about his most recent labs. Explained that his VL has increased again. Asked if he has been taking his medication; patient reports that he missed doses due to ADAP certification problems + delivery complications. Patient has been scheduled with Cassie on 10/4. Instructed to continue to take meds daily and call office with any questions or concerns.   Eyvette Cordon Lorita Officer, RN

## 2020-08-12 NOTE — Telephone Encounter (Signed)
Same day is just fine

## 2020-08-15 ENCOUNTER — Other Ambulatory Visit: Payer: Self-pay

## 2020-08-15 DIAGNOSIS — B20 Human immunodeficiency virus [HIV] disease: Secondary | ICD-10-CM

## 2020-09-11 ENCOUNTER — Ambulatory Visit (INDEPENDENT_AMBULATORY_CARE_PROVIDER_SITE_OTHER): Payer: Self-pay | Admitting: Pharmacist

## 2020-09-11 ENCOUNTER — Other Ambulatory Visit: Payer: Self-pay

## 2020-09-11 DIAGNOSIS — B20 Human immunodeficiency virus [HIV] disease: Secondary | ICD-10-CM

## 2020-09-11 NOTE — Progress Notes (Signed)
HPI: Luis Vincent is a 34 y.o. male who presents to the [facility] clinic for HIV follow-up.  Patient Active Problem List   Diagnosis Date Noted  . Tinea corporis 03/29/2015  . HIV disease ([place]) 02/17/2014  . 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   BICTEGRAVIR-EMTRICITABINE-TENOFOVIR AF (BIKTARVY) 50-200-25 MG TABS TABLET    Take 1 tablet by mouth daily.   FLUTICASONE (FLONASE) 50 MCG/ACT NASAL SPRAY    Place 1 spray into both nostrils daily.  Modified Medications   No medications on file  Discontinued Medications   No medications on file    Allergies: No Known Allergies  Past Medical History: Past Medical History:  Diagnosis Date  . Headache   . HIV infection ([place])   . Immune deficiency disorder ([place])   . Syphilis     Social History: Social History   Socioeconomic History  . Marital status: Single    Spouse name: Not on file  . Number of children: Not on file  . Years of education: Not on file  . Highest education level: Not on file  Occupational History  . Not on file  Tobacco Use  . Smoking status: Never Smoker  . Smokeless tobacco: Never Used  Substance and Sexual Activity  . Alcohol use: No  . Drug use: No  . Sexual activity: Not Currently    Comment: condoms declined  Other Topics Concern  . Not on file  Social History Narrative  . Not on file   Social Determinants of Health   Financial Resource Strain:   . Difficulty of Paying Living Expenses: Not on file  Food Insecurity:   . Worried About Charity fundraiser in the Last Year: Not on file  . Ran Out of Food in the Last Year: Not on file  Transportation Needs:   . Lack of Transportation (Medical): Not on file  . Lack of Transportation (Non-Medical): Not on file  Physical Activity:   . Days  of Exercise per Week: Not on file  . Minutes of Exercise per Session: Not on file  Stress:   . Feeling of Stress : Not on file  Social Connections:   . Frequency of Communication with Friends and Family: Not on file  . Frequency of Social Gatherings with Friends and Family: Not on file  . Attends Religious Services: Not on file  . Active Member of Clubs or Organizations: Not on file  . Attends Archivist Meetings: Not on file  . Marital Status: Not on file    Labs: Lab Results  Component Value Date   HIV1RNAQUANT 93,600 (H) 08/07/2020   HIV1RNAQUANT 1,950 (H) 05/29/2020   HIV1RNAQUANT 334,000 (H) 03/13/2020   CD4TABS 232 (L) 08/07/2020   CD4TABS 224 (L) 05/29/2020   CD4TABS 149 (L) 03/13/2020    RPR and STI Lab Results  Component Value Date   LABRPR NON-REACTIVE 08/07/2020   LABRPR NON-REACTIVE 05/29/2020   LABRPR NON-REACTIVE 03/13/2020   LABRPR NON-REACTIVE 09/14/2019   LABRPR NON-REACTIVE 01/19/2019   RPRTITER 1:1 02/05/2016   RPRTITER 1:2 03/29/2015   RPRTITER 1:4 09/08/2014   RPRTITER 1:128 (A) 02/17/2014    STI Results GC CT  08/07/2020 Negative Negative  05/29/2020 Negative Negative  03/13/2020 Negative Negative  10/03/2019 **POSITIVE**(A) Negative  02/03/2019 Negative Negative  02/03/2019 Negative Negative  01/19/2019 Negative Negative  10/20/2018 **POSITIVE**(A) Negative  12/22/2017 **POSITIVE**(A) Negative  10/04/2017 **POSITIVE**(A) Negative  11/26/2016 Negative Negative  03/29/2015 Negative Negative  02/17/2014 NG: Negative CT: Negative    Hepatitis B Lab Results  Component Value Date   HEPBSAB POS (A) 02/17/2014   HEPBSAG NEGATIVE 02/17/2014   HEPBCAB NON REACTIVE 02/17/2014   Hepatitis C No results found for: HEPCAB, HCVRNAPCRQN Hepatitis A Lab Results  Component Value Date   HAV BORDERLINE (A) 02/17/2014   Lipids: Lab Results  Component Value Date   CHOL 231 (H) 03/13/2020   TRIG 114 03/13/2020   HDL 34 (L) 03/13/2020   CHOLHDL  6.8 (H) 03/13/2020   VLDL 12 02/05/2016   LDLCALC 173 (H) 03/13/2020    Current HIV Regimen: Biktarvy  Assessment: Caliber is here today to follow up for HIV medication adherence.  He has struggled with adherence on and off throughout his diagnosis and was recently seen by Dr. Tommy Medal on 8/30 where he was found to have a HIV viral load of 93,600.  He tells me today that his viral load was that high due to his ADAP running out and issues with getting his Biktarvy shipped to him like he usually does. His ADAP should not have lapsed until 9/30, so I assume he was not getting his refills. He started taking Biktarvy again after his last visit but has missed several doses since then.  I asked him how many and he could not think of how many. I asked if it was more than a week's worth and he said yes. He was taking his Biktarvy at random times of the day, but he has recently started taking it every morning when he wakes up to try and maintain a consistent schedule. I spent time explaining the issues with being nonadherent and the consequences that can occur. Also discussed risk of transmission with a high viral load.  He is not taking any other prescription medications, OTC medications, herbals or supplements.  He states that he is trying to do better. He is going to [facility] after he leaves this appointment to pick up his refill. I asked him to discuss shipping with them as that works better with his schedule. I advised him that we want to see him monthly until he can get undetectable. I will check labs today to see where his viral load is and schedule him with Dr. Tommy Medal next month. I am happy to see him again after that to continue adherence monitoring.   Plan: - Continue Biktarvy daily - HIV viral load today - F/u with Dr. Tommy Medal 11/15 at [facility]. Ruchama Kubicek, PharmD, BCIDP, AAHIVP, CPP Clinical Pharmacist Practitioner Infectious [facility] [place] [facility] 09/11/2020, 10:24 AM

## 2020-09-13 LAB — HIV-1 RNA QUANT-NO REFLEX-BLD
HIV 1 RNA Quant: <20 Copies/mL
HIV-1 RNA Quant, Log: <1.3 Log cps/mL

## 2020-10-23 ENCOUNTER — Ambulatory Visit: Payer: Self-pay | Admitting: Infectious Disease

## 2021-03-24 ENCOUNTER — Other Ambulatory Visit: Payer: Self-pay | Admitting: Infectious Disease

## 2021-03-24 DIAGNOSIS — B20 Human immunodeficiency virus [HIV] disease: Secondary | ICD-10-CM

## 2021-03-26 ENCOUNTER — Ambulatory Visit: Payer: Self-pay

## 2021-03-26 ENCOUNTER — Telehealth: Payer: Self-pay

## 2021-03-26 ENCOUNTER — Other Ambulatory Visit: Payer: Self-pay

## 2021-03-26 DIAGNOSIS — Z113 Encounter for screening for infections with a predominantly sexual mode of transmission: Secondary | ICD-10-CM

## 2021-03-26 DIAGNOSIS — Z79899 Other long term (current) drug therapy: Secondary | ICD-10-CM

## 2021-03-26 DIAGNOSIS — B20 Human immunodeficiency virus [HIV] disease: Secondary | ICD-10-CM

## 2021-03-26 NOTE — Telephone Encounter (Signed)
Our office received a refill request for patient's Biktarvy.  Patient also needs to renew his ADAP. I have spoke to patient and he is scheduled today for labs and financial today.  He is scheduled for follow with Dr. Tommy Medal on 04/18/21. Patient stated he has not missed any doses of Biktarvy, does not need a refill at this time and has a whole bottle of Biktarvy left. Deah Ottaway T Brooks Sailors

## 2021-03-27 ENCOUNTER — Ambulatory Visit: Payer: Self-pay

## 2021-03-27 ENCOUNTER — Other Ambulatory Visit: Payer: Self-pay

## 2021-04-18 ENCOUNTER — Ambulatory Visit: Payer: 59

## 2021-04-18 ENCOUNTER — Encounter: Payer: Self-pay | Admitting: Infectious Disease

## 2021-04-18 ENCOUNTER — Other Ambulatory Visit: Payer: Self-pay

## 2021-04-18 ENCOUNTER — Ambulatory Visit (INDEPENDENT_AMBULATORY_CARE_PROVIDER_SITE_OTHER): Payer: 59 | Admitting: Infectious Disease

## 2021-04-18 VITALS — BP 146/82 | HR 63 | Temp 98.0°F | Wt 274.0 lb

## 2021-04-18 DIAGNOSIS — B20 Human immunodeficiency virus [HIV] disease: Secondary | ICD-10-CM

## 2021-04-18 DIAGNOSIS — Z79899 Other long term (current) drug therapy: Secondary | ICD-10-CM | POA: Diagnosis not present

## 2021-04-18 DIAGNOSIS — Z113 Encounter for screening for infections with a predominantly sexual mode of transmission: Secondary | ICD-10-CM | POA: Diagnosis not present

## 2021-04-18 DIAGNOSIS — A5149 Other secondary syphilitic conditions: Secondary | ICD-10-CM

## 2021-04-18 MED ORDER — BIKTARVY 50-200-25 MG PO TABS
1.0000 | ORAL_TABLET | Freq: Every day | ORAL | 5 refills | Status: DC
Start: 2021-04-18 — End: 2021-08-01

## 2021-04-18 NOTE — Progress Notes (Signed)
Subjective:  Chief followup for  HIV disease on Biktarvy   Patient ID: Luis Vincent, male    DOB: 01-Jul-1986, 35 y.o.   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   He apparently lost insurance and did not come in and get his HIV medication assistance program engaged until recently.   He was w VL over 334K in April 2021   His CD4 had dropped below 200.  I gave him several bottles of Biktarvy and he got back on the HIV medication assistance plan.    Are low then came down to 1009 or 50 copies before rising again to 93,600 copies in August 2021.  At last visit in September 11, 2020 his viral load was less than 20 and undetectable.  And asking him about missed doses he seems to minimize how much of a problem this is been.  He denies having problems with alcohol or substance abuse.  He largely seems to blame his episodes of virological failure on not being prescribed medications.  I have gone over again the fact that he needs to make sure that he comes in for renewal of HMA P programs to not have him go without medications.  He told me today that he had roughly 7 tablets of BIKTARVY left.  I gave him another 4 blister packs of 7 days BIKTARVY as well.  He met with Timmothy Sours to enroll in [facility]       Past Medical History:  Diagnosis Date  . Headache   . HIV infection ([place])   . Immune deficiency disorder ([place])   . Syphilis     No past surgical history on file.  No family history on file.    Social History   Socioeconomic History  . Marital status: Single    Spouse name: Not on file  . Number of children: Not on file  . Years of education: Not on file  . Highest education level: Not on file  Occupational History  . Not on file  Tobacco Use  .  Smoking status: Never Smoker  . Smokeless tobacco: Never Used  Substance and Sexual Activity  . Alcohol use: No  . Drug use: No  . Sexual activity: Not Currently    Comment: condoms declined 04/2021  Other Topics Concern  . Not on file  Social History Narrative  . Not on file   Social Determinants of Health   Financial Resource Strain: Not on file  Food Insecurity: Not on file  Transportation Needs: Not on file  Physical Activity: Not on file  Stress: Not on file  Social Connections: Not on file    No Known Allergies   Current Outpatient Medications:  .  bictegravir-emtricitabine-tenofovir AF (BIKTARVY) 50-200-25 MG TABS tablet, Take 1 tablet by mouth daily., Disp: 30 tablet, Rfl: 5 .  benzonatate (TESSALON) 100 MG capsule, Take 1 capsule (100 mg total) by mouth every 8 (eight) hours. (Patient not taking: No sig reported), Disp: 21 capsule, Rfl: 0 .  fluticasone (FLONASE) 50 MCG/ACT nasal spray, Place 1 spray into both nostrils daily. (Patient not taking: No sig reported), Disp: 11.1 mL, Rfl: 0    Review of Systems  Constitutional: Negative for activity change, appetite change, chills, diaphoresis, fatigue, fever and unexpected weight change.  HENT: Negative for  congestion, rhinorrhea, sinus pressure, sneezing, sore throat and trouble swallowing.   Eyes: Negative for photophobia and visual disturbance.  Respiratory: Negative for cough, chest tightness, shortness of breath, wheezing and stridor.   Cardiovascular: Negative for chest pain, palpitations and leg swelling.  Gastrointestinal: Negative for abdominal distention, abdominal pain, anal bleeding, blood in stool, constipation, diarrhea, nausea and vomiting.  Genitourinary: Negative for difficulty urinating, dysuria, flank pain and hematuria.  Musculoskeletal: Negative for arthralgias, back pain, gait problem, joint swelling and myalgias.  Skin: Negative for color change, pallor and wound.  Neurological: Negative for  dizziness, tremors, weakness and light-headedness.  Hematological: Negative for adenopathy. Does not bruise/bleed easily.  Psychiatric/Behavioral: Negative for agitation, behavioral problems, confusion, decreased concentration, dysphoric mood, self-injury and sleep disturbance.       Objective:   Physical Exam Constitutional:      Appearance: He is well-developed.  HENT:     Head: Normocephalic and atraumatic.  Eyes:     Conjunctiva/sclera: Conjunctivae normal.  Cardiovascular:     Rate and Rhythm: Normal rate and regular rhythm.  Pulmonary:     Effort: Pulmonary effort is normal. No respiratory distress.     Breath sounds: No wheezing.  Abdominal:     General: There is no distension.     Palpations: Abdomen is soft.  Musculoskeletal:        General: No tenderness. Normal range of motion.     Cervical back: Normal range of motion and neck supple.  Skin:    General: Skin is warm and dry.     Findings: No erythema or rash.  Neurological:     Mental Status: He is alert and oriented to person, place, and time.  Psychiatric:        Mood and Affect: Mood normal.        Behavior: Behavior normal.        Thought Content: Thought content normal.        Judgment: Judgment normal.          Assessment & Plan:   HIV: Rechecking labs again today and I have scheduled him with the financial counselor and myself in August.   He denies having problems with substance abuse or depression or anxiety or stigma around taking his medications.  Denies having trouble with substance abuse or other more frequent causes of nonadherence.    Syphilis titers: NON-REACTIVE (08/30 0956) sp treatment  I spent greater than 30  minutes with the patient including greater than 50% of time in face to face counsel of the patient and in coordination of his care.

## 2021-04-19 LAB — T-HELPER CELL (CD4) - (RCID CLINIC ONLY)
CD4 % Helper T Cell: 16 % — ABNORMAL LOW (ref 33–65)
CD4 T Cell Abs: 412 /uL (ref 400–1790)

## 2021-04-20 LAB — URINE CYTOLOGY ANCILLARY ONLY
Chlamydia: NEGATIVE
Comment: NEGATIVE
Comment: NORMAL
Neisseria Gonorrhea: NEGATIVE

## 2021-04-22 LAB — COMPLETE METABOLIC PANEL WITH GFR
AG Ratio: 1.8 (calc) (ref 1.0–2.5)
ALT: 31 U/L (ref 9–46)
AST: 26 U/L (ref 10–40)
Albumin: 4.4 g/dL (ref 3.6–5.1)
Alkaline phosphatase (APISO): 52 U/L (ref 36–130)
BUN/Creatinine Ratio: 7 (calc) (ref 6–22)
BUN: 12 mg/dL (ref 7–25)
CO2: 25 mmol/L (ref 20–32)
Calcium: 9.6 mg/dL (ref 8.6–10.3)
Chloride: 107 mmol/L (ref 98–110)
Creat: 1.63 mg/dL — ABNORMAL HIGH (ref 0.60–1.35)
GFR, Est African American: 62 mL/min/{1.73_m2} (ref >=60)
GFR, Est Non African American: 54 mL/min/{1.73_m2} — ABNORMAL LOW (ref >=60)
Globulin: 2.4 g/dL (calc) (ref 1.9–3.7)
Glucose, Bld: 83 mg/dL (ref 65–99)
Potassium: 4.2 mmol/L (ref 3.5–5.3)
Sodium: 141 mmol/L (ref 135–146)
Total Bilirubin: 0.7 mg/dL (ref 0.2–1.2)
Total Protein: 6.8 g/dL (ref 6.1–8.1)

## 2021-04-22 LAB — CBC WITH DIFFERENTIAL/PLATELET
Absolute Monocytes: 320 cells/uL (ref 200–950)
Basophils Absolute: 72 cells/uL (ref 0–200)
Basophils Relative: 1.6 %
Eosinophils Absolute: 158 cells/uL (ref 15–500)
Eosinophils Relative: 3.5 %
HCT: 46.9 % (ref 38.5–50.0)
Hemoglobin: 15.6 g/dL (ref 13.2–17.1)
Lymphs Abs: 2772 cells/uL (ref 850–3900)
MCH: 29.5 pg (ref 27.0–33.0)
MCHC: 33.3 g/dL (ref 32.0–36.0)
MCV: 88.7 fL (ref 80.0–100.0)
MPV: 9.9 fL (ref 7.5–12.5)
Monocytes Relative: 7.1 %
Neutro Abs: 1179 cells/uL — ABNORMAL LOW (ref 1500–7800)
Neutrophils Relative %: 26.2 %
Platelets: 273 10*3/uL (ref 140–400)
RBC: 5.29 10*6/uL (ref 4.20–5.80)
RDW: 12.9 % (ref 11.0–15.0)
Total Lymphocyte: 61.6 %
WBC: 4.5 10*3/uL (ref 3.8–10.8)

## 2021-04-22 LAB — LIPID PANEL
Cholesterol: 243 mg/dL — ABNORMAL HIGH (ref <200)
HDL: 37 mg/dL — ABNORMAL LOW (ref >=40)
LDL Cholesterol (Calc): 181 mg/dL (calc) — ABNORMAL HIGH
Non-HDL Cholesterol (Calc): 206 mg/dL (calc) — ABNORMAL HIGH (ref <130)
Total CHOL/HDL Ratio: 6.6 (calc) — ABNORMAL HIGH (ref <5.0)
Triglycerides: 118 mg/dL (ref <150)

## 2021-04-22 LAB — RPR: RPR Ser Ql: NONREACTIVE

## 2021-04-22 LAB — HIV-1 RNA QUANT-NO REFLEX-BLD
HIV 1 RNA Quant: <20 Copies/mL — ABNORMAL HIGH
HIV-1 RNA Quant, Log: <1.3 Log cps/mL — ABNORMAL HIGH

## 2021-04-23 ENCOUNTER — Encounter: Payer: Self-pay | Admitting: Infectious Disease

## 2021-04-23 ENCOUNTER — Telehealth: Payer: Self-pay

## 2021-04-23 NOTE — Telephone Encounter (Signed)
-----   Message from Truman Hayward, MD sent at 04/22/2021  3:21 PM EDT ----- Very proud of maintaining or maintaining his virological suppression again!@

## 2021-04-23 NOTE — Telephone Encounter (Signed)
Attempted to contact patient, voicemail box is "full". Will attempt to reach patient at a later time.  Luis Vincent

## 2021-04-23 NOTE — Telephone Encounter (Signed)
Patient returned missed call and nurse relayed recent lab results. No questions or concerned voiced by the patient.  Luis Vincent

## 2021-06-06 ENCOUNTER — Other Ambulatory Visit: Payer: Self-pay | Admitting: Family

## 2021-06-06 DIAGNOSIS — B20 Human immunodeficiency virus [HIV] disease: Secondary | ICD-10-CM

## 2021-07-18 ENCOUNTER — Ambulatory Visit: Payer: Self-pay | Admitting: Infectious Disease

## 2021-07-18 ENCOUNTER — Ambulatory Visit: Payer: Self-pay

## 2021-07-19 ENCOUNTER — Ambulatory Visit: Payer: 59

## 2021-07-19 ENCOUNTER — Other Ambulatory Visit: Payer: Self-pay

## 2021-07-31 ENCOUNTER — Encounter: Payer: Self-pay | Admitting: Infectious Disease

## 2021-08-01 ENCOUNTER — Encounter: Payer: Self-pay | Admitting: Infectious Disease

## 2021-08-01 ENCOUNTER — Ambulatory Visit (INDEPENDENT_AMBULATORY_CARE_PROVIDER_SITE_OTHER): Payer: 59 | Admitting: Infectious Disease

## 2021-08-01 ENCOUNTER — Other Ambulatory Visit: Payer: Self-pay

## 2021-08-01 VITALS — BP 135/89 | HR 71 | Temp 98.3°F | Wt 274.0 lb

## 2021-08-01 DIAGNOSIS — B20 Human immunodeficiency virus [HIV] disease: Secondary | ICD-10-CM | POA: Diagnosis not present

## 2021-08-01 DIAGNOSIS — E669 Obesity, unspecified: Secondary | ICD-10-CM

## 2021-08-01 DIAGNOSIS — R400 Somnolence: Secondary | ICD-10-CM | POA: Diagnosis not present

## 2021-08-01 DIAGNOSIS — A5149 Other secondary syphilitic conditions: Secondary | ICD-10-CM

## 2021-08-01 HISTORY — DX: Obesity, unspecified: E66.9

## 2021-08-01 HISTORY — DX: Somnolence: R40.0

## 2021-08-01 MED ORDER — BIKTARVY 50-200-25 MG PO TABS: 1.0000 | ORAL_TABLET | Freq: Every day | ORAL | 11 refills | Status: DC

## 2021-08-01 NOTE — Progress Notes (Signed)
Subjective:  Chief complaint: Follow-up for HIV disease on BIKTARVY with some concerns that BIKTARVY is making him sleepy.   Patient ID: Luis Vincent, male    DOB: 07/03/1986, 35 y.o.   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   He apparently lost insurance and did not come in and get his HIV medication assistance program engaged until recently.   He was w VL over 334K in April 2021   His CD4 had dropped below 200.  I gave him several bottles of Biktarvy and he got back on the HIV medication assistance plan.    Are low then came down to 1009 or 50 copies before rising again to 93,600 copies in August 2021.  At last visit in September 11, 2020 his viral load was less than 20 and undetectable.  And asking him about missed doses he seems to minimize how much of a problem this is been.  He denied having problems with alcohol or substance abuse.  He largely seems to blame his episodes of virological failure on not being prescribed medications.  I have gone over again the fact that he needs to make sure that he comes in for renewal of HMA P programs to not have him go without medications.  He told me t at visit in March that he had only 7 tablets of BIKTARVY left.  I gave him another 4 blister packs of 7 days BIKTARVY as well. At his visit with me in May of 2022.  Tells me today he is consistently taking his BIKTARVY and we will recheck his labs today.  He has renewed HIV medication assistance program.      Past Medical History:  Diagnosis Date   Headache    HIV infection ([place])    Immune deficiency disorder ([place])    Syphilis     No past surgical history on file.  No family history on file.    Social History   Socioeconomic History   Marital  status: Single    Spouse name: Not on file   Number of children: Not on file   Years of education: Not on file   Highest education level: Not on file  Occupational History   Not on file  Tobacco Use   Smoking status: Never   Smokeless tobacco: Never  Substance and Sexual Activity   Alcohol use: No   Drug use: No    Comment: declined condoms   Sexual activity: Not Currently    Comment: condoms declined 04/2021  Other Topics Concern   Not on file  Social History Narrative   Not on file   Social Determinants of Health   Financial Resource Strain: Not on file  Food Insecurity: Not on file  Transportation Needs: Not on file  Physical Activity: Not on file  Stress: Not on file  Social Connections: Not on file    No Known Allergies   Current Outpatient Medications:    benzonatate (TESSALON) 100 MG capsule, Take 1 capsule (100 mg total) by mouth every 8 (eight) hours. (Patient not taking: No sig reported), Disp: 21 capsule, Rfl: 0   bictegravir-emtricitabine-tenofovir AF (BIKTARVY) 50-200-25 MG TABS tablet, Take 1 tablet by mouth daily., Disp: 30 tablet, Rfl: 11   fluticasone (FLONASE) 50 MCG/ACT nasal spray, Place 1 spray into  both nostrils daily. (Patient not taking: No sig reported), Disp: 11.1 mL, Rfl: 0    Review of Systems  Constitutional:  Negative for activity change, appetite change, chills, diaphoresis, fatigue, fever and unexpected weight change.  HENT:  Negative for congestion, rhinorrhea, sinus pressure, sneezing, sore throat and trouble swallowing.   Eyes:  Negative for photophobia and visual disturbance.  Respiratory:  Negative for cough, chest tightness, shortness of breath, wheezing and stridor.   Cardiovascular:  Negative for chest pain, palpitations and leg swelling.  Gastrointestinal:  Negative for abdominal distention, abdominal pain, anal bleeding, blood in stool, constipation, diarrhea, nausea and vomiting.  Genitourinary:  Negative for difficulty  urinating, dysuria, flank pain and hematuria.  Musculoskeletal:  Negative for arthralgias, back pain, gait problem, joint swelling and myalgias.  Skin:  Negative for color change, pallor, rash and wound.  Neurological:  Negative for dizziness, tremors, weakness and light-headedness.  Hematological:  Negative for adenopathy. Does not bruise/bleed easily.  Psychiatric/Behavioral:  Negative for agitation, behavioral problems, confusion, decreased concentration, dysphoric mood and sleep disturbance.       Objective:   Physical Exam Constitutional:      Appearance: He is well-developed.  HENT:     Head: Normocephalic and atraumatic.  Eyes:     General:        Right eye: No discharge.        Left eye: No discharge.     Conjunctiva/sclera: Conjunctivae normal.  Cardiovascular:     Rate and Rhythm: Normal rate and regular rhythm.  Pulmonary:     Effort: Pulmonary effort is normal. No respiratory distress.     Breath sounds: No wheezing.  Abdominal:     General: There is no distension.     Palpations: Abdomen is soft.  Musculoskeletal:        General: No tenderness. Normal range of motion.     Cervical back: Normal range of motion and neck supple.  Skin:    General: Skin is warm and dry.     Coloration: Skin is not pale.     Findings: No erythema or rash.  Neurological:     General: No focal deficit present.     Mental Status: He is alert and oriented to person, place, and time.  Psychiatric:        Mood and Affect: Mood normal.        Behavior: Behavior normal.        Thought Content: Thought content normal.        Judgment: Judgment normal.         Assessment & Plan:   HIV disease:  I am checking a viral load CD4 count CMP CBC with differential  I am continuing his prescription for BIKTARVY.  Sleepiness and fatigue which she attributes to taking BIKTARVY: I have suggested that he start taking the BIKTARVY at nighttime since he has these symptoms now and has taken  BIKTARVY in the day.  We can also consider changing him to a different antiretroviral though I think he would tolerate most the ones I am thinking about less well than BIKTARVY and he does need an antiviral with a high barrier to resistance.  Syphilis: He seems serofast when last check labs were rechecked again today.    Obesity: Can work on weight loss with carbohydrate restriction and diet

## 2021-08-02 ENCOUNTER — Telehealth: Payer: Self-pay

## 2021-08-02 LAB — T-HELPER CELL (CD4) - (RCID CLINIC ONLY)
CD4 % Helper T Cell: 18 % — ABNORMAL LOW (ref 33–65)
CD4 T Cell Abs: 461 /uL (ref 400–1790)

## 2021-08-02 NOTE — Telephone Encounter (Signed)
Notified patient about elevated creatinine; instructed to push fluids and encouraged hydration. Explained that creatinine has been elevated in past but it's steadily getting worse; RN scheduled for repeat BMP in 2 weeks per Dr. Tommy Medal request.   Carlean Purl, RN

## 2021-08-03 LAB — COMPLETE METABOLIC PANEL WITH GFR
AG Ratio: 1.6 (calc) (ref 1.0–2.5)
ALT: 27 U/L (ref 9–46)
AST: 23 U/L (ref 10–40)
Albumin: 4.4 g/dL (ref 3.6–5.1)
Alkaline phosphatase (APISO): 51 U/L (ref 36–130)
BUN/Creatinine Ratio: 12 (calc) (ref 6–22)
BUN: 21 mg/dL (ref 7–25)
CO2: 28 mmol/L (ref 20–32)
Calcium: 9.5 mg/dL (ref 8.6–10.3)
Chloride: 104 mmol/L (ref 98–110)
Creat: 1.71 mg/dL — ABNORMAL HIGH (ref 0.60–1.26)
Globulin: 2.7 g/dL (calc) (ref 1.9–3.7)
Glucose, Bld: 103 mg/dL — ABNORMAL HIGH (ref 65–99)
Potassium: 4.4 mmol/L (ref 3.5–5.3)
Sodium: 140 mmol/L (ref 135–146)
Total Bilirubin: 0.7 mg/dL (ref 0.2–1.2)
Total Protein: 7.1 g/dL (ref 6.1–8.1)
eGFR: 53 mL/min/{1.73_m2} — ABNORMAL LOW (ref >=60)

## 2021-08-03 LAB — CBC WITH DIFFERENTIAL/PLATELET
Absolute Monocytes: 398 cells/uL (ref 200–950)
Basophils Absolute: 78 cells/uL (ref 0–200)
Basophils Relative: 1.4 %
Eosinophils Absolute: 90 cells/uL (ref 15–500)
Eosinophils Relative: 1.6 %
HCT: 46.1 % (ref 38.5–50.0)
Hemoglobin: 15.2 g/dL (ref 13.2–17.1)
Lymphs Abs: 2839 cells/uL (ref 850–3900)
MCH: 29.3 pg (ref 27.0–33.0)
MCHC: 33 g/dL (ref 32.0–36.0)
MCV: 88.8 fL (ref 80.0–100.0)
MPV: 10 fL (ref 7.5–12.5)
Monocytes Relative: 7.1 %
Neutro Abs: 2195 cells/uL (ref 1500–7800)
Neutrophils Relative %: 39.2 %
Platelets: 316 10*3/uL (ref 140–400)
RBC: 5.19 10*6/uL (ref 4.20–5.80)
RDW: 13.4 % (ref 11.0–15.0)
Total Lymphocyte: 50.7 %
WBC: 5.6 10*3/uL (ref 3.8–10.8)

## 2021-08-03 LAB — LIPID PANEL
Cholesterol: 244 mg/dL — ABNORMAL HIGH (ref <200)
HDL: 33 mg/dL — ABNORMAL LOW (ref >=40)
LDL Cholesterol (Calc): 169 mg/dL (calc) — ABNORMAL HIGH
Non-HDL Cholesterol (Calc): 211 mg/dL (calc) — ABNORMAL HIGH (ref <130)
Total CHOL/HDL Ratio: 7.4 (calc) — ABNORMAL HIGH (ref <5.0)
Triglycerides: 254 mg/dL — ABNORMAL HIGH (ref <150)

## 2021-08-03 LAB — HIV-1 RNA QUANT-NO REFLEX-BLD
HIV 1 RNA Quant: <20 Copies/mL — ABNORMAL HIGH
HIV-1 RNA Quant, Log: <1.3 Log cps/mL — ABNORMAL HIGH

## 2021-08-03 LAB — RPR: RPR Ser Ql: NONREACTIVE

## 2021-08-08 ENCOUNTER — Telehealth: Payer: Self-pay

## 2021-08-08 NOTE — Telephone Encounter (Signed)
Patient called asking about lab results. Relayed that his viral load was undetectable and he was negative for syphilis. Reminded patient of upcoming lab appointment to recheck kidney function. Patient verbalized understanding and has no further questions.    Beryle Flock, RN

## 2021-08-16 ENCOUNTER — Other Ambulatory Visit: Payer: Self-pay

## 2021-08-16 DIAGNOSIS — B20 Human immunodeficiency virus [HIV] disease: Secondary | ICD-10-CM

## 2021-08-16 LAB — BASIC METABOLIC PANEL
BUN/Creatinine Ratio: 10 (calc) (ref 6–22)
BUN: 15 mg/dL (ref 7–25)
CO2: 23 mmol/L (ref 20–32)
Calcium: 9.6 mg/dL (ref 8.6–10.3)
Chloride: 103 mmol/L (ref 98–110)
Creat: 1.57 mg/dL — ABNORMAL HIGH (ref 0.60–1.26)
Glucose, Bld: 140 mg/dL — ABNORMAL HIGH (ref 65–99)
Potassium: 4 mmol/L (ref 3.5–5.3)
Sodium: 138 mmol/L (ref 135–146)

## 2021-09-17 ENCOUNTER — Ambulatory Visit: Payer: 59 | Admitting: Infectious Disease

## 2021-09-26 ENCOUNTER — Ambulatory Visit (INDEPENDENT_AMBULATORY_CARE_PROVIDER_SITE_OTHER): Payer: 59 | Admitting: Infectious Disease

## 2021-09-26 ENCOUNTER — Other Ambulatory Visit: Payer: Self-pay

## 2021-09-26 ENCOUNTER — Encounter: Payer: Self-pay | Admitting: Infectious Disease

## 2021-09-26 VITALS — BP 131/81 | HR 73 | Temp 98.3°F | Ht 71.0 in | Wt 272.0 lb

## 2021-09-26 DIAGNOSIS — R400 Somnolence: Secondary | ICD-10-CM

## 2021-09-26 DIAGNOSIS — B20 Human immunodeficiency virus [HIV] disease: Secondary | ICD-10-CM

## 2021-09-26 DIAGNOSIS — Z23 Encounter for immunization: Secondary | ICD-10-CM

## 2021-09-26 NOTE — Progress Notes (Signed)
Subjective:  Chief complaint: He is continue to feel sleepy during the day   Patient ID: Luis Vincent, male    DOB: Jul 12, 1986, 35 y.o.   MRN: [id number]  HPI  35 year old black man living with HIV that has been well controlled recently on Biktarvy to which she has been highly adherent.  Last visit he was wondering whether some of the sleepiness he was experiencing was due to [facility]  He still complaining of sleepiness today but when I asked him about this it appears that the sleepiness is likely related to his job.  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  He lives with his mother who does tell him that he snores.  He does not remember waking up at night gasping for air he was not sleepy 6 months ago when he was not on the schedule.       Past Medical History:  Diagnosis Date   Headache    HIV infection ([place])    Immune deficiency disorder ([place])    Obesity 08/01/2021   Sleepiness 08/01/2021   Syphilis     No past surgical history on file.  No family history on file.    Social History   Socioeconomic History   Marital status: Single    Spouse name: Not on file   Number of children: Not on file   Years of education: Not on file   Highest education level: Not on file  Occupational History   Not on file  Tobacco Use   Smoking status: Never   Smokeless tobacco: Never  Substance and Sexual Activity   Alcohol use: No   Drug use: No    Comment: declined condoms   Sexual activity: Not Currently    Comment: condoms declined 09/2021  Other Topics Concern   Not on file  Social History  Narrative   Not on file   Social Determinants of Health   Financial Resource Strain: Not on file  Food Insecurity: Not on file  Transportation Needs: Not on file  Physical Activity: Not on file  Stress: Not on file  Social Connections: Not on file    No Known Allergies   Current Outpatient Medications:    bictegravir-emtricitabine-tenofovir AF (BIKTARVY) 50-200-25 MG TABS tablet, Take 1 tablet by mouth daily., Disp: 30 tablet, Rfl: 11   benzonatate (TESSALON) 100 MG capsule, Take 1 capsule (100 mg total) by mouth every 8 (eight) hours., Disp: 21 capsule, Rfl: 0   fluticasone (FLONASE) 50 MCG/ACT nasal spray, Place 1 spray into both nostrils daily., Disp: 11.1 mL, Rfl: 0    Review of Systems  Constitutional:  Positive for fatigue. Negative for activity change, appetite change, chills, diaphoresis, fever and unexpected weight change.  HENT:  Negative for congestion, rhinorrhea, sinus pressure, sneezing, sore throat and trouble swallowing.   Eyes:  Negative for photophobia and visual disturbance.  Respiratory:  Negative for cough, chest tightness, shortness of breath, wheezing and stridor.   Cardiovascular:  Negative for chest pain, palpitations and leg swelling.  Gastrointestinal:  Negative  for abdominal distention, abdominal pain, anal bleeding, blood in stool, constipation, diarrhea, nausea and vomiting.  Genitourinary:  Negative for difficulty urinating, dysuria, flank pain and hematuria.  Musculoskeletal:  Negative for arthralgias, back pain, gait problem, joint swelling and myalgias.  Skin:  Negative for color change, pallor, rash and wound.  Neurological:  Negative for dizziness, tremors, weakness and light-headedness.  Hematological:  Negative for adenopathy. Does not bruise/bleed easily.  Psychiatric/Behavioral:  Negative for agitation, behavioral problems, confusion, decreased concentration, dysphoric mood and sleep disturbance.       Objective:   Physical  Exam Constitutional:      Appearance: He is well-developed. He is obese.  HENT:     Head: Normocephalic and atraumatic.  Eyes:     Conjunctiva/sclera: Conjunctivae normal.  Cardiovascular:     Rate and Rhythm: Normal rate and regular rhythm.  Pulmonary:     Effort: Pulmonary effort is normal. No respiratory distress.     Breath sounds: No wheezing.  Abdominal:     General: There is no distension.     Palpations: Abdomen is soft.  Musculoskeletal:        General: No tenderness. Normal range of motion.     Cervical back: Normal range of motion and neck supple.  Skin:    General: Skin is warm and dry.     Coloration: Skin is not pale.     Findings: No erythema or rash.  Neurological:     General: No focal deficit present.     Mental Status: He is alert and oriented to person, place, and time.  Psychiatric:        Mood and Affect: Mood normal.        Behavior: Behavior normal.        Thought Content: Thought content normal.        Judgment: Judgment normal.         Assessment & Plan:   HIV disease:  I have reviewed his labs from his last visit August 01, 2021 in which his viral load was less than 20 and his CD4 count was 461  I am continung his Biktarvy  Lab Results  Component Value Date   HIV1RNAQUANT <20 (H) 08/01/2021   Lab Results  Component Value Date   CD4TABS 461 08/01/2021   CD4TABS 412 04/18/2021   CD4TABS 232 (L) 08/07/2020   Sleepiness: This seems related to his sleep hygiene which is not ideal because of his job he does have snoring and is obese and certainly could have sleep apnea so there is some to consider as well.  Obesity: He has been losing weight recently with more busy job continue to make efforts to lose further weight through diet and exercise modification  Syphilis serofast:   Vaccine counseling we gave him influenza vaccine today

## 2021-10-30 ENCOUNTER — Other Ambulatory Visit: Payer: Self-pay

## 2021-10-30 DIAGNOSIS — B20 Human immunodeficiency virus [HIV] disease: Secondary | ICD-10-CM

## 2021-10-30 MED ORDER — BIKTARVY 50-200-25 MG PO TABS: 1.0000 | ORAL_TABLET | Freq: Every day | ORAL | 4 refills | Status: DC

## 2021-10-30 NOTE — Telephone Encounter (Signed)
Received faxed refill request from [facility] for [place]. Medication was last sent to [facility], it appears patient has typically filled through [facility].   Attempted to call patient to confirm, no answer. RN called [facility] and cancelled prescription with them and resent to [facility].   Beryle Flock, RN

## 2022-02-15 ENCOUNTER — Other Ambulatory Visit: Payer: Self-pay

## 2022-02-15 DIAGNOSIS — B20 Human immunodeficiency virus [HIV] disease: Secondary | ICD-10-CM

## 2022-02-15 DIAGNOSIS — Z113 Encounter for screening for infections with a predominantly sexual mode of transmission: Secondary | ICD-10-CM

## 2022-02-18 ENCOUNTER — Telehealth: Payer: Self-pay

## 2022-02-18 LAB — CBC WITH DIFFERENTIAL/PLATELET
Absolute Monocytes: 542 cells/uL (ref 200–950)
Basophils Absolute: 51 cells/uL (ref 0–200)
Basophils Relative: 0.9 %
Eosinophils Absolute: 68 cells/uL (ref 15–500)
Eosinophils Relative: 1.2 %
HCT: 43.7 % (ref 38.5–50.0)
Hemoglobin: 14.5 g/dL (ref 13.2–17.1)
Lymphs Abs: 2810 cells/uL (ref 850–3900)
MCH: 28.8 pg (ref 27.0–33.0)
MCHC: 33.2 g/dL (ref 32.0–36.0)
MCV: 86.9 fL (ref 80.0–100.0)
MPV: 9.9 fL (ref 7.5–12.5)
Monocytes Relative: 9.5 %
Neutro Abs: 2229 cells/uL (ref 1500–7800)
Neutrophils Relative %: 39.1 %
Platelets: 204 10*3/uL (ref 140–400)
RBC: 5.03 10*6/uL (ref 4.20–5.80)
RDW: 13.4 % (ref 11.0–15.0)
Total Lymphocyte: 49.3 %
WBC: 5.7 10*3/uL (ref 3.8–10.8)

## 2022-02-18 LAB — T-HELPER CELLS (CD4) COUNT (NOT AT ARMC)
Absolute CD4: 648 cells/uL (ref 490–1740)
CD4 T Helper %: 19 % — ABNORMAL LOW (ref 30–61)
Total lymphocyte count: 3346 cells/uL (ref 850–3900)

## 2022-02-18 LAB — COMPLETE METABOLIC PANEL WITH GFR
AG Ratio: 1.4 (calc) (ref 1.0–2.5)
ALT: 16 U/L (ref 9–46)
AST: 18 U/L (ref 10–40)
Albumin: 4 g/dL (ref 3.6–5.1)
Alkaline phosphatase (APISO): 72 U/L (ref 36–130)
BUN/Creatinine Ratio: 9 (calc) (ref 6–22)
BUN: 15 mg/dL (ref 7–25)
CO2: 26 mmol/L (ref 20–32)
Calcium: 9.2 mg/dL (ref 8.6–10.3)
Chloride: 105 mmol/L (ref 98–110)
Creat: 1.61 mg/dL — ABNORMAL HIGH (ref 0.60–1.26)
Globulin: 2.9 g/dL (calc) (ref 1.9–3.7)
Glucose, Bld: 87 mg/dL (ref 65–99)
Potassium: 4.3 mmol/L (ref 3.5–5.3)
Sodium: 139 mmol/L (ref 135–146)
Total Bilirubin: 0.5 mg/dL (ref 0.2–1.2)
Total Protein: 6.9 g/dL (ref 6.1–8.1)
eGFR: 56 mL/min/{1.73_m2} — ABNORMAL LOW (ref >=60)

## 2022-02-18 LAB — C. TRACHOMATIS/N. GONORRHOEAE RNA
C. trachomatis RNA, TMA: NOT DETECTED
N. gonorrhoeae RNA, TMA: NOT DETECTED

## 2022-02-18 LAB — HIV-1 RNA QUANT-NO REFLEX-BLD
HIV 1 RNA Quant: <20 Copies/mL — ABNORMAL HIGH
HIV-1 RNA Quant, Log: <1.3 Log cps/mL — ABNORMAL HIGH

## 2022-02-18 LAB — RPR: RPR Ser Ql: NONREACTIVE

## 2022-02-18 NOTE — Telephone Encounter (Signed)
Patient called wanting to discuss recent test results from 02/15/22. Relayed that viral load is undetectable and CD4 count is still processing. STI testing negative. Relayed that creatinine is still elevated and recommended he address this at upcoming appointment with Dr. Tommy Medal.  ? ?Beryle Flock, RN ? ?

## 2022-02-27 ENCOUNTER — Encounter: Payer: Self-pay | Admitting: Infectious Disease

## 2022-02-27 ENCOUNTER — Other Ambulatory Visit: Payer: Self-pay

## 2022-02-27 ENCOUNTER — Ambulatory Visit (INDEPENDENT_AMBULATORY_CARE_PROVIDER_SITE_OTHER): Payer: Self-pay | Admitting: Infectious Disease

## 2022-02-27 VITALS — BP 148/88 | HR 65 | Temp 98.5°F | Wt 288.0 lb

## 2022-02-27 DIAGNOSIS — E669 Obesity, unspecified: Secondary | ICD-10-CM

## 2022-02-27 DIAGNOSIS — A5149 Other secondary syphilitic conditions: Secondary | ICD-10-CM

## 2022-02-27 DIAGNOSIS — B20 Human immunodeficiency virus [HIV] disease: Secondary | ICD-10-CM

## 2022-02-27 DIAGNOSIS — K649 Unspecified hemorrhoids: Secondary | ICD-10-CM | POA: Insufficient documentation

## 2022-02-27 HISTORY — DX: Unspecified hemorrhoids: K64.9

## 2022-02-27 MED ORDER — BIKTARVY 50-200-25 MG PO TABS: 1.0000 | ORAL_TABLET | Freq: Every day | ORAL | 11 refills | Status: DC

## 2022-02-27 NOTE — Progress Notes (Signed)
? ?Subjective:  ?Chief complaint: Follow-up for HIV disease on medications ? ?Patient ID: Luis Vincent, male    DOB: [birth year], 36 y.o.   MRN: [id number] ? ?HPI ? ?36 year old black man living with HIV that has been well controlled recently on Biktarvy to which she has been highly adherent. ? ?He has renewed his HIV medication assistance program. ? ?He is trying to lose weight but having difficulty with this. ? ?He did have problems with hemorrhoids recently that improved with steroid suppository. ? ? ? ? ? ? ?Past Medical History:  ?Diagnosis Date  ? Headache   ? HIV infection ([place])   ? Immune deficiency disorder ([place])   ? Obesity 08/01/2021  ? Sleepiness 08/01/2021  ? Syphilis   ? ? ?No past surgical history on file. ? ?No family history on file. ? ?  ?Social History  ? ?Socioeconomic History  ? Marital status: Single  ?  Spouse name: Not on file  ? Number of children: Not on file  ? Years of education: Not on file  ? Highest education level: Not on file  ?Occupational History  ? Not on file  ?Tobacco Use  ? Smoking status: Never  ? Smokeless tobacco: Never  ?Substance and Sexual Activity  ? Alcohol use: No  ? Drug use: No  ?  Comment: declined condoms  ? Sexual activity: Not Currently  ?  Comment: declined condoms  ?Other Topics Concern  ? Not on file  ?Social History Narrative  ? Not on file  ? ?Social Determinants of Health  ? ?Financial Resource Strain: Not on file  ?Food Insecurity: Not on file  ?Transportation Needs: Not on file  ?Physical Activity: Not on file  ?Stress: Not on file  ?Social Connections: Not on file  ? ? ?No Known Allergies ? ? ?Current Outpatient Medications:  ?  bictegravir-emtricitabine-tenofovir AF (BIKTARVY) 50-200-25 MG TABS tablet, Take 1 tablet by mouth daily., Disp: 30 tablet, Rfl: 4 ?  benzonatate (TESSALON) 100 MG capsule, Take 1 capsule (100 mg total) by mouth every 8 (eight) hours. (Patient not taking: Reported on 02/27/2022), Disp: 21 capsule, Rfl: 0 ?  fluticasone (FLONASE)  50 MCG/ACT nasal spray, Place 1 spray into both nostrils daily. (Patient not taking: Reported on 02/27/2022), Disp: 11.1 mL, Rfl: 0 ? ? ? ?Review of Systems  ?Constitutional:  Negative for activity change, appetite change, chills, diaphoresis, fatigue, fever and unexpected weight change.  ?HENT:  Negative for congestion, rhinorrhea, sinus pressure, sneezing, sore throat and trouble swallowing.   ?Eyes:  Negative for photophobia and visual disturbance.  ?Respiratory:  Negative for cough, chest tightness, shortness of breath, wheezing and stridor.   ?Cardiovascular:  Negative for chest pain, palpitations and leg swelling.  ?Gastrointestinal:  Negative for abdominal distention, abdominal pain, anal bleeding, blood in stool, constipation, diarrhea, nausea and vomiting.  ?Genitourinary:  Negative for difficulty urinating, dysuria, flank pain and hematuria.  ?Musculoskeletal:  Negative for arthralgias, back pain, gait problem, joint swelling and myalgias.  ?Skin:  Negative for color change, pallor, rash and wound.  ?Neurological:  Negative for dizziness, tremors, weakness and light-headedness.  ?Hematological:  Negative for adenopathy. Does not bruise/bleed easily.  ?Psychiatric/Behavioral:  Negative for agitation, behavioral problems, confusion, decreased concentration, dysphoric mood and sleep disturbance.   ? ?   ?Objective:  ? Physical Exam ?Constitutional:   ?   Appearance: He is well-developed.  ?HENT:  ?   Head: Normocephalic and atraumatic.  ?Eyes:  ?   Conjunctiva/sclera: Conjunctivae normal.  ?Cardiovascular:  ?  Rate and Rhythm: Normal rate and regular rhythm.  ?Pulmonary:  ?   Effort: Pulmonary effort is normal. No respiratory distress.  ?   Breath sounds: No wheezing.  ?Abdominal:  ?   General: There is no distension.  ?   Palpations: Abdomen is soft.  ?Musculoskeletal:     ?   General: No tenderness. Normal range of motion.  ?   Cervical back: Normal range of motion and neck supple.  ?Skin: ?   General: Skin  is warm and dry.  ?   Coloration: Skin is not pale.  ?   Findings: No erythema or rash.  ?Neurological:  ?   General: No focal deficit present.  ?   Mental Status: He is alert and oriented to person, place, and time.  ?Psychiatric:     ?   Mood and Affect: Mood normal.     ?   Behavior: Behavior normal.     ?   Thought Content: Thought content normal.     ?   Judgment: Judgment normal.  ? ? ? ? ?   ?Assessment & Plan:  ? ?HIV disease: ? ?I reviewed his viral load from February 15, 2022 which was less than 20 and CD4 count from same date which was 648. ? ?Lab Results  ?Component Value Date  ? HIV1RNAQUANT <20 (H) 02/15/2022  ? ?Lab Results  ?Component Value Date  ? CD4TABS 461 08/01/2021  ? CD4TABS 412 04/18/2021  ? CD4TABS 232 (L) 08/07/2020  ? ?Continue his BIKTARVY ? ?Syphilis: RPR now nonreactive ? ?Obesity: Continue carbohydrate restriction exercise. ? ? ?Hemorrhoids: Symptoms are resolving he is increased fiber in his diet as well. ? ? ? ?

## 2022-03-22 ENCOUNTER — Emergency Department (HOSPITAL_COMMUNITY)
Admission: EM | Admit: 2022-03-22 | Discharge: 2022-03-23 | Disposition: A | Discharge status: Home / Self Care | Payer: Self-pay | Attending: Emergency Medicine | Admitting: Emergency Medicine

## 2022-03-22 ENCOUNTER — Encounter (HOSPITAL_COMMUNITY): Payer: Self-pay | Admitting: Emergency Medicine

## 2022-03-22 ENCOUNTER — Other Ambulatory Visit: Payer: Self-pay

## 2022-03-22 DIAGNOSIS — R21 Rash and other nonspecific skin eruption: Secondary | ICD-10-CM

## 2022-03-22 DIAGNOSIS — Z21 Asymptomatic human immunodeficiency virus [HIV] infection status: Secondary | ICD-10-CM | POA: Insufficient documentation

## 2022-03-22 DIAGNOSIS — L509 Urticaria, unspecified: Secondary | ICD-10-CM | POA: Insufficient documentation

## 2022-03-22 MED ORDER — DIPHENHYDRAMINE HCL 25 MG PO CAPS
50.0000 mg | ORAL_CAPSULE | Freq: Once | ORAL | Status: DC
  Filled 2022-03-22: qty 2

## 2022-03-22 MED ORDER — LORATADINE 10 MG PO TABS
10.0000 mg | ORAL_TABLET | Freq: Once | ORAL | Status: AC
  Administered 2022-03-22: 10 mg via ORAL
  Filled 2022-03-22: qty 1

## 2022-03-22 MED ORDER — FAMOTIDINE 20 MG PO TABS
20.0000 mg | ORAL_TABLET | Freq: Once | ORAL | Status: AC
  Administered 2022-03-22: 20 mg via ORAL
  Filled 2022-03-22: qty 1

## 2022-03-22 NOTE — ED Triage Notes (Signed)
Patient with rash on bilateral extremities and palms.  Started yesterday. ?

## 2022-03-22 NOTE — ED Provider Triage Note (Signed)
Emergency Medicine Provider Triage Evaluation Note ? ?Luis Vincent , a 36 y.o. male  was evaluated in triage.  Pt complains of rash since yesterday. First noted to palms then to the rest of the body. Rash is pruritic. Thinks this may be related to caring for his family's dog who he has not been around but has been around other dogs before without this response. Denies recent tic bites/removals or time in wooded areas. Denies being currently sexually active or any new recent sexual partners. Denies trouble breathing or sensation of throat closing.  ? ?Review of Systems  ?Per HPI ? ?Physical Exam  ?BP (!) 146/85 (BP Location: Right Arm)   Pulse (!) 101   Temp 98.6 ?F (37 ?C)   Resp 18   SpO2 94%  ?Gen:   Awake, no distress   ?Resp:  Normal effort  ?MSK:   Moves extremities without difficulty  ?Other:  Rash present. No angioedema.  ? ?Medical Decision Making  ?Medically screening exam initiated at 11:28 PM.  Appropriate orders placed.  Luis Vincent was informed that the remainder of the evaluation will be completed by another provider, this initial triage assessment does not replace that evaluation, and the importance of remaining in the ED until their evaluation is complete. ? ?Rash  ?  ?Amaryllis Dyke, PA-C ?03/22/22 2329 ? ?

## 2022-03-23 LAB — RPR
RPR Ser Ql: REACTIVE — AB
RPR Titer: 1:1 {titer}

## 2022-03-23 MED ORDER — FAMOTIDINE 20 MG PO TABS: 20.0000 mg | ORAL_TABLET | Freq: Two times a day (BID) | ORAL | 0 refills | Status: DC | PRN

## 2022-03-23 MED ORDER — DIPHENHYDRAMINE HCL 25 MG PO TABS: 25.0000 mg | ORAL_TABLET | Freq: Four times a day (QID) | ORAL | 0 refills | Status: DC | PRN

## 2022-03-23 NOTE — ED Provider Notes (Signed)
?[facility] ?Provider Note ? ? ?CSN: [id number] ?Arrival date & time: 03/22/22  2311 ? ?  ? ?History ? ?Chief Complaint  ?Patient presents with  ? Rash  ? ? ?Luis Vincent is a 36 y.o. male with a history of HIV last viral load undetectable as well as prior syphilis with most recent RPR nonreactive who presents to the emergency department with complaints of rash that started yesterday.  Patient states the rash started on his palms and then spread to his general extremities and torso.  Rash is pruritic.  No alleviating or aggravating factors.  He thinks that this may be related to caring for his family dog as it started shortly after this.  He has been around other dogs without issue, however this is the first time he has spent time around this dog.  He denies fever, sensation of throat closing, or trouble breathing.  He denies being in wooded areas or removing ticks recently.  He denies any recent new sexual partners. ? ?HPI ? ?  ? ?Home Medications ?Prior to Admission medications   ?Medication Sig Start Date End Date Taking? Authorizing Provider  ?diphenhydrAMINE (BENADRYL) 25 MG tablet Take 1 tablet (25 mg total) by mouth every 6 (six) hours as needed. 03/23/22  Yes Jenya Putz R, PA-C  ?famotidine (PEPCID) 20 MG tablet Take 1 tablet (20 mg total) by mouth 2 (two) times daily as needed (rash, itching). 03/23/22  Yes Declyn Offield R, PA-C  ?benzonatate (TESSALON) 100 MG capsule Take 1 capsule (100 mg total) by mouth every 8 (eight) hours. ?Patient not taking: Reported on 02/27/2022 12/09/19   Henderly, Britni A, PA-C  ?bictegravir-emtricitabine-tenofovir AF (BIKTARVY) 50-200-25 MG TABS tablet Take 1 tablet by mouth daily. 02/27/22   Truman Hayward, MD  ?fluticasone [facility]) 50 MCG/ACT nasal spray Place 1 spray into both nostrils daily. ?Patient not taking: Reported on 02/27/2022 12/09/19   Henderly, Britni A, PA-C  ?   ? ?Allergies    ?Patient has no known  allergies.   ? ?Review of Systems   ?Review of Systems  ?Constitutional:  Negative for chills and fever.  ?HENT:  Negative for trouble swallowing.   ?Respiratory:  Negative for shortness of breath.   ?Cardiovascular:  Negative for chest pain.  ?Gastrointestinal:  Negative for vomiting.  ?Skin:  Positive for rash.  ?Neurological:  Negative for syncope.  ?All other systems reviewed and are negative. ? ?Physical Exam ?Updated Vital Signs ?BP 140/85 (BP Location: Right Arm)   Pulse 100   Temp 98.2 ?F (36.8 ?C) (Oral)   Resp 19   SpO2 96%  ?Physical Exam ?Vitals and nursing note reviewed.  ?Constitutional:   ?   General: He is not in acute distress. ?   Appearance: He is well-developed. He is not toxic-appearing.  ?HENT:  ?   Head: Normocephalic and atraumatic.  ?   Mouth/Throat:  ?   Comments: To oropharynx is clear.  No angioedema.  Tolerating secretions without difficulty.  Airways patent. ?Eyes:  ?   General:     ?   Right eye: No discharge.     ?   Left eye: No discharge.  ?   Conjunctiva/sclera: Conjunctivae normal.  ?Cardiovascular:  ?   Rate and Rhythm: Normal rate and regular rhythm.  ?Pulmonary:  ?   Effort: No respiratory distress.  ?   Breath sounds: Normal breath sounds. No wheezing or rales.  ?Abdominal:  ?   General: There is  no distension.  ?   Palpations: Abdomen is soft.  ?   Tenderness: There is no abdominal tenderness.  ?Musculoskeletal:  ?   Cervical back: Neck supple.  ?Skin: ?   General: Skin is warm and dry.  ?   Findings: Rash present. No petechiae. Rash is not purpuric, pustular or vesicular.  ?   Comments: Patient has a somewhat urticarial appearing erythematous rash throughout the trunk and extremities, noted on the palms and soles as pictured below.  No mucous membrane involvement.  ?Neurological:  ?   Mental Status: He is alert.  ?   Comments: Clear speech.   ?Psychiatric:     ?   Behavior: Behavior normal.  ? ? ? ? ? ? ?ED Results / Procedures / Treatments   ?Labs ?(all labs ordered are  listed, but only abnormal results are displayed) ?Labs Reviewed  ?RPR  ?GC/CHLAMYDIA PROBE AMP ([place]) NOT AT [facility]  ? ? ?EKG ?None ? ?Radiology ?No results found. ? ?Procedures ?Procedures  ? ? ?Medications Ordered in ED ?Medications  ?famotidine (PEPCID) tablet 20 mg (20 mg Oral Given 03/22/22 2346)  ?loratadine (CLARITIN) tablet 10 mg (10 mg Oral Given 03/22/22 2356)  ? ? ?ED Course/ Medical Decision Making/ A&P ?  ?                        ?Medical Decision Making ?Amount and/or Complexity of Data Reviewed ?Labs: ordered. ? ?Risk ?OTC drugs. ? ?Patient presents to the emergency department with complaints of rash since yesterday.  Nontoxic, resting comfortably, vitals with initial tachycardia and hypertension which improved with repeat vital signs.  I evaluated the patient in triage and ordered Claritin and Pepcid.  Avoid Benadryl given patient is driving.  No findings of anaphylaxis or angioedema. ? ?I reviewed external records including patient's most recent viral load. ? ?Patient feeling improved status post Claritin and Pepcid.  Given patient with new exposure, rash is pruritic and overall fairly urticarial appearing favor allergic reaction.  However given rashes on the palms and soles also consider [facility] spotted fever, clinically seems unlikely without known tick exposure or time in wooded areas.  Also considered syphilis, patient has had this in the past, most recent RPR is nonreactive, will check for this as well as other STDs today per discussion with the patient.  We will treat as allergic reaction with Benadryl and Pepcid, allergist information provided for follow-up. I discussed treatment plan, need for follow-up, and return precautions with the patient. Provided opportunity for questions, patient confirmed understanding and is in agreement with plan.  ? ?Discussed w/ attending- in agreement.  ?Final Clinical Impression(s) / ED Diagnoses ?Final diagnoses:  ?Rash  ? ? ?Rx / DC Orders ?ED  Discharge Orders   ? ?      Ordered  ?  diphenhydrAMINE (BENADRYL) 25 MG tablet  Every 6 hours PRN       ? 03/23/22 0637  ?  famotidine (PEPCID) 20 MG tablet  2 times daily PRN       ? 03/23/22 7035  ? ?  ?  ? ?  ? ? ?  ?Amaryllis Dyke, PA-C ?03/23/22 0645 ? ?  ?Fatima Blank, MD ?03/23/22 0820 ? ?

## 2022-03-23 NOTE — Discharge Instructions (Addendum)
You were seen in the emergency department today for a rash.  We suspect this is likely an allergic reaction.  Please avoid the dog that may have induced the rash.  Please take Benadryl and Pepcid as needed for rash and itching. ? ?Please follow-up with the allergist provided in your discharge instructions. ? ?We have tested you for syphilis, gonorrhea, and chlamydia, we will call you if these results are abnormal. ? ?Please follow-up with your primary care provider.  Return to the emergency department for new or worsening symptoms including but not limited to new or worsening rash, fever, trouble breathing, throat closing sensation, or any other concerns. ? ?

## 2022-03-24 ENCOUNTER — Emergency Department (HOSPITAL_BASED_OUTPATIENT_CLINIC_OR_DEPARTMENT_OTHER)
Admission: EM | Admit: 2022-03-24 | Discharge: 2022-03-24 | Disposition: A | Discharge status: Home / Self Care | Payer: Self-pay | Attending: Emergency Medicine | Admitting: Emergency Medicine

## 2022-03-24 DIAGNOSIS — Z79899 Other long term (current) drug therapy: Secondary | ICD-10-CM | POA: Insufficient documentation

## 2022-03-24 DIAGNOSIS — A539 Syphilis, unspecified: Secondary | ICD-10-CM | POA: Insufficient documentation

## 2022-03-24 DIAGNOSIS — R21 Rash and other nonspecific skin eruption: Secondary | ICD-10-CM

## 2022-03-24 MED ORDER — PREDNISONE 50 MG PO TABS: 50.0000 mg | ORAL_TABLET | Freq: Every day | ORAL | 0 refills | Status: AC

## 2022-03-24 MED ORDER — PREDNISONE 50 MG PO TABS
50.0000 mg | ORAL_TABLET | Freq: Once | ORAL | Status: AC
  Administered 2022-03-24: 50 mg via ORAL
  Filled 2022-03-24: qty 1

## 2022-03-24 MED ORDER — PENICILLIN G BENZATHINE 1200000 UNIT/2ML IM SUSY
2.4000 10*6.[IU] | PREFILLED_SYRINGE | Freq: Once | INTRAMUSCULAR | Status: AC
Start: 2022-03-24 — End: 2022-03-24
  Administered 2022-03-24: 2.4 10*6.[IU] via INTRAMUSCULAR
  Filled 2022-03-24: qty 4

## 2022-03-24 NOTE — Discharge Instructions (Signed)
Your history, exam, and testing last night are suggestive of recurrent syphilis.  I spoke to the infection disease team who recommended treatment with the penicillin shot which we provided.  They also agreed that due to the inflammatory and allergy type response with your rash they felt it was reasonable to do a burst of steroids for the next few days.  Please take that for the next 4 days starting tomorrow as you received today's dose already.  You may also use antihistamines at home to help with the itching.  Please rest and stay hydrated.  If any symptoms change or worsen acutely, please return to the nearest emergency department. ?

## 2022-03-24 NOTE — ED Notes (Signed)
Dc instructions reviewed with patient. Patient voiced understanding. Dc with belongings.  °

## 2022-03-24 NOTE — ED Provider Notes (Signed)
?[facility] EMERGENCY DEPT ?Provider Note ? ? ?CSN: [id number] ?Arrival date & time: 03/24/22  4403 ? ?  ? ?History ? ?Chief Complaint  ?Patient presents with  ? Rash  ? ? ?Luis Vincent is a 36 y.o. male. ? ?The history is provided by the patient. No language interpreter was used.  ?Rash ?Location:  Full body ?Quality: itchiness, painful and redness   ?Pain details:  ?  Quality:  Aching ?  Severity:  Moderate ?  Onset quality:  Gradual ?  Duration:  3 days ?  Timing:  Constant ?  Progression:  Worsening ?Context: animal contact   ?Relieved by:  Nothing ?Worsened by:  Nothing ?Ineffective treatments:  None tried ?Associated symptoms: no abdominal pain, no diarrhea, no fatigue, no fever, no headaches, no induration, no joint pain, no myalgias, no nausea, no shortness of breath, not vomiting and not wheezing   ? ?  ? ?Home Medications ?Prior to Admission medications   ?Medication Sig Start Date End Date Taking? Authorizing Provider  ?benzonatate (TESSALON) 100 MG capsule Take 1 capsule (100 mg total) by mouth every 8 (eight) hours. ?Patient not taking: Reported on 02/27/2022 12/09/19   Henderly, Britni A, PA-C  ?bictegravir-emtricitabine-tenofovir AF (BIKTARVY) 50-200-25 MG TABS tablet Take 1 tablet by mouth daily. 02/27/22   Truman Hayward, MD  ?diphenhydrAMINE (BENADRYL) 25 MG tablet Take 1 tablet (25 mg total) by mouth every 6 (six) hours as needed. 03/23/22   Petrucelli, Samantha R, PA-C  ?famotidine (PEPCID) 20 MG tablet Take 1 tablet (20 mg total) by mouth 2 (two) times daily as needed (rash, itching). 03/23/22   Petrucelli, Samantha R, PA-C  ?fluticasone (FLONASE) 50 MCG/ACT nasal spray Place 1 spray into both nostrils daily. ?Patient not taking: Reported on 02/27/2022 12/09/19   Henderly, Britni A, PA-C  ?   ? ?Allergies    ?Patient has no known allergies.   ? ?Review of Systems   ?Review of Systems  ?Constitutional:  Negative for chills, fatigue and fever.  ?HENT:  Negative for congestion.    ?Eyes:  Negative for visual disturbance.  ?Respiratory:  Negative for cough, chest tightness, shortness of breath and wheezing.   ?Cardiovascular:  Negative for chest pain.  ?Gastrointestinal:  Negative for abdominal pain, constipation, diarrhea, nausea and vomiting.  ?Genitourinary:  Negative for dysuria, flank pain and frequency.  ?Musculoskeletal:  Negative for arthralgias, myalgias and neck pain.  ?Skin:  Positive for rash.  ?Neurological:  Negative for weakness, light-headedness, numbness and headaches.  ?Psychiatric/Behavioral:  Negative for agitation and confusion.   ?All other systems reviewed and are negative. ? ?Physical Exam ?Updated Vital Signs ?BP (!) 159/90   Pulse 95   Temp 98.6 ?F (37 ?C)   Resp 16   SpO2 100%  ?Physical Exam ?Vitals and nursing note reviewed.  ?Constitutional:   ?   General: He is not in acute distress. ?   Appearance: He is well-developed. He is not ill-appearing, toxic-appearing or diaphoretic.  ?HENT:  ?   Head: Normocephalic and atraumatic.  ?   Mouth/Throat:  ?   Mouth: Mucous membranes are moist.  ?   Pharynx: No oropharyngeal exudate or posterior oropharyngeal erythema.  ?Eyes:  ?   Extraocular Movements: Extraocular movements intact.  ?   Conjunctiva/sclera: Conjunctivae normal.  ?   Pupils: Pupils are equal, round, and reactive to light.  ?Cardiovascular:  ?   Rate and Rhythm: Normal rate and regular rhythm.  ?   Heart sounds: No murmur heard. ?  Pulmonary:  ?   Effort: Pulmonary effort is normal. No respiratory distress.  ?   Breath sounds: Normal breath sounds. No wheezing, rhonchi or rales.  ?Chest:  ?   Chest wall: No tenderness.  ?Abdominal:  ?   General: Abdomen is flat. There is no distension.  ?   Palpations: Abdomen is soft.  ?   Tenderness: There is no abdominal tenderness. There is no right CVA tenderness, left CVA tenderness, guarding or rebound.  ?Musculoskeletal:     ?   General: No swelling or tenderness.  ?   Cervical back: Neck supple. No rigidity or  tenderness.  ?Skin: ?   General: Skin is warm and dry.  ?   Capillary Refill: Capillary refill takes less than 2 seconds.  ?   Findings: Rash present.  ?   Comments: Diffuse rash including on palms.  No rash seen in the mouth.  ?Neurological:  ?   General: No focal deficit present.  ?   Mental Status: He is alert and oriented to person, place, and time. Mental status is at baseline.  ?   Sensory: No sensory deficit.  ?   Motor: No weakness.  ?Psychiatric:     ?   Mood and Affect: Mood normal.  ? ? ?ED Results / Procedures / Treatments   ?Labs ?(all labs ordered are listed, but only abnormal results are displayed) ?Labs Reviewed - No data to display ? ?EKG ?None ? ?Radiology ?No results found. ? ?Procedures ?Procedures  ? ? ?Medications Ordered in ED ?Medications  ?penicillin g benzathine (BICILLIN LA) 1200000 UNIT/2ML injection 2.4 Million Units (2.4 Million Units Intramuscular Given 03/24/22 0855)  ?predniSONE (DELTASONE) tablet 50 mg (50 mg Oral Given 03/24/22 0855)  ? ? ?ED Course/ Medical Decision Making/ A&P ?  ?                        ?Medical Decision Making ?Risk ?Prescription drug management. ? ? ? ?Luis Vincent is a 36 y.o. male with a past medical history significant for HIV and previous syphilis who presents with worsening diffuse painful urticarial rash and a now positive RPR test.  According to patient, for the last few days he has had rash that has been spreading and progressing from his palms and soles to his limbs to his torso and now diffusely.  He was seen yesterday and had some lab work performed including recommend spotted fever test and repeat RPR although it was negative last month.  Patient reports that he was given Benadryl and Pepcid and symptom started to improve and then he was able to go home but overnight the rash continues to spread and bother him.  He otherwise denies any fevers, chills, headache, severe neck pain, nausea, vomiting, constipation, diarrhea, urinary symptoms.  Denies  any neurologic deficits in any extremities. ? ?On exam, lungs clear and chest nontender.  Abdomen nontender.  Full range of motion of neck.  Pupils symmetric and reactive normal extraocular movements.  Symmetric smile.  Clear speech.  No acute neurologic deficits seen initially.  Patient does have diffuse rash including on his palms.  No rash seen in the mouth.  It is diffuse but is slightly urticarial in appearance as well. ? ?Due to the positive RPR yesterday after being negative for several years, I am concerned about new syphilis. ? ?Called the infection disease team to discuss a plan.  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  They  recommended 2,400,000 units of penicillin and then follow-up in their clinic this week.  They did say that due to the allergic type rash they would be okay with him getting a burst of steroids and any antihistamines as well. ? ?We will discuss with patient. ? ?8:25 AM ?Patient agrees with the penicillin treatment and then some steroids.  He drove here so we will hold on other antihistamines as he will do this at home. ? ?Anticipate reassessment shortly and discharge home following with plans to follow-up with his infectious disease team this week. ? ?9:22 AM ?After antibiotics and oral steroids he is already starting to feel better.  He agrees with discharge home and will use over-the-counter antihistamines as well for the itching.  Patient was given a burst of steroids and will follow-up with his infectious disease team this week. ? ? ? ? ? ? ? ?Final Clinical Impression(s) / ED Diagnoses ?Final diagnoses:  ?Syphilis  ?Rash  ? ? ?Rx / DC Orders ?ED Discharge Orders   ? ?      Ordered  ?  predniSONE (DELTASONE) 50 MG tablet  Daily       ? 03/24/22 0923  ? ?  ?  ? ?  ? ? ?Clinical Impression: ?1. Syphilis   ?2. Rash   ? ? ?Disposition: Discharge ? ?Condition: Good ? ?I have discussed the results, Dx and Tx plan with the pt(& family if present). He/she/they  expressed understanding and agree(s) with the plan. Discharge instructions discussed at great length. Strict return precautions discussed and pt &/or family have verbalized understanding of the instructions. No furt

## 2022-03-24 NOTE — ED Triage Notes (Signed)
Pt presents with rash (red raised bumps) to entire body ,started on his hands and has progressed. Pt states only c hange is he has been watching his mother's dog that is an outside dog and wondering if dog was exposed to something. Started Friday. Throat hurts,hard to swallow, benadryl did not help.  ?

## 2022-03-25 LAB — T.PALLIDUM AB, TOTAL: T Pallidum Abs: REACTIVE — AB

## 2022-07-10 ENCOUNTER — Ambulatory Visit: Payer: Self-pay | Admitting: Infectious Disease

## 2022-07-16 ENCOUNTER — Ambulatory Visit: Payer: Self-pay | Admitting: Infectious Disease

## 2022-08-07 ENCOUNTER — Other Ambulatory Visit: Payer: Self-pay

## 2022-08-07 ENCOUNTER — Other Ambulatory Visit (HOSPITAL_COMMUNITY): Payer: Self-pay

## 2022-08-07 ENCOUNTER — Encounter: Payer: Self-pay | Admitting: Infectious Disease

## 2022-08-07 ENCOUNTER — Ambulatory Visit (INDEPENDENT_AMBULATORY_CARE_PROVIDER_SITE_OTHER): Payer: Self-pay | Admitting: Infectious Disease

## 2022-08-07 VITALS — BP 155/102 | HR 71 | Temp 97.9°F | Wt 293.0 lb

## 2022-08-07 DIAGNOSIS — Z7185 Encounter for immunization safety counseling: Secondary | ICD-10-CM

## 2022-08-07 DIAGNOSIS — N189 Chronic kidney disease, unspecified: Secondary | ICD-10-CM

## 2022-08-07 DIAGNOSIS — N1832 Chronic kidney disease, stage 3b: Secondary | ICD-10-CM

## 2022-08-07 DIAGNOSIS — E669 Obesity, unspecified: Secondary | ICD-10-CM

## 2022-08-07 DIAGNOSIS — I10 Essential (primary) hypertension: Secondary | ICD-10-CM

## 2022-08-07 DIAGNOSIS — Z0000 Encounter for general adult medical examination without abnormal findings: Secondary | ICD-10-CM | POA: Insufficient documentation

## 2022-08-07 DIAGNOSIS — B20 Human immunodeficiency virus [HIV] disease: Secondary | ICD-10-CM

## 2022-08-07 DIAGNOSIS — N183 Chronic kidney disease, stage 3 unspecified: Secondary | ICD-10-CM | POA: Insufficient documentation

## 2022-08-07 DIAGNOSIS — Z113 Encounter for screening for infections with a predominantly sexual mode of transmission: Secondary | ICD-10-CM

## 2022-08-07 HISTORY — DX: Chronic kidney disease, unspecified: N18.9

## 2022-08-07 HISTORY — DX: Essential (primary) hypertension: I10

## 2022-08-07 HISTORY — DX: Encounter for immunization safety counseling: Z71.85

## 2022-08-07 HISTORY — DX: Encounter for screening for infections with a predominantly sexual mode of transmission: Z11.3

## 2022-08-07 MED ORDER — BIKTARVY 50-200-25 MG PO TABS: 1.0000 | ORAL_TABLET | Freq: Every day | ORAL | 11 refills | Status: DC

## 2022-08-07 MED ORDER — AMLODIPINE BESYLATE 10 MG PO TABS: 10.0000 mg | ORAL_TABLET | Freq: Every day | ORAL | 11 refills | Status: DC

## 2022-08-07 NOTE — Progress Notes (Signed)
Subjective:  Chief complaint: followup for HIV disease on medications  Patient ID: Luis Vincent, male    DOB: 1986-03-17, 36 y.o.   MRN: [id number]  HPI  36 year old black man living with HIV that has been well controlled recently on Biktarvy to which she has been highly adherent.  Luis Vincent does need to renew his eye Program and [employer] which we will do today we will also get repeat labs blood pressure remains elevated and I suspect this may be contributing to his chronic kidney disease.  He did ask about Cabenuva and had extensive discussion about this regimen versus oral medications        Past Medical History:  Diagnosis Date   Headache    Hemorrhoid 02/27/2022   HIV infection ([place])    Immune deficiency disorder ([place])    Obesity 08/01/2021   Sleepiness 08/01/2021   Syphilis     No past surgical history on file.  No family history on file.    Social History   Socioeconomic History   Marital status: Single    Spouse name: Not on file   Number of children: Not on file   Years of education: Not on file   Highest education level: Not on file  Occupational History   Not on file  Tobacco Use   Smoking status: Never   Smokeless tobacco: Never  Substance and Sexual Activity   Alcohol use: No   Drug use: No    Comment: declined condoms   Sexual activity: Not Currently    Comment: declined condoms  Other Topics Concern   Not on file  Social History Narrative   Not on file   Social Determinants of Health   Financial Resource Strain: Not on file  Food Insecurity: Not on file  Transportation Needs: Not on file  Physical Activity: Not on file  Stress: Not on file  Social Connections: Not on file    No Known Allergies   Current Outpatient Medications:    benzonatate (TESSALON) 100 MG capsule, Take 1 capsule (100 mg total) by mouth every 8 (eight) hours. (Patient not taking: Reported on 02/27/2022), Disp: 21 capsule, Rfl: 0    bictegravir-emtricitabine-tenofovir AF (BIKTARVY) 50-200-25 MG TABS tablet, Take 1 tablet by mouth daily., Disp: 30 tablet, Rfl: 11   diphenhydrAMINE (BENADRYL) 25 MG tablet, Take 1 tablet (25 mg total) by mouth every 6 (six) hours as needed., Disp: 30 tablet, Rfl: 0   famotidine (PEPCID) 20 MG tablet, Take 1 tablet (20 mg total) by mouth 2 (two) times daily as needed (rash, itching)., Disp: 15 tablet, Rfl: 0   fluticasone (FLONASE) 50 MCG/ACT nasal spray, Place 1 spray into both nostrils daily. (Patient not taking: Reported on 02/27/2022), Disp: 11.1 mL, Rfl: 0    Review of Systems  Constitutional:  Negative for activity change, appetite change, chills, diaphoresis, fatigue, fever and unexpected weight change.  HENT:  Negative for congestion, rhinorrhea, sinus pressure, sneezing, sore throat and trouble swallowing.   Eyes:  Negative for photophobia and visual disturbance.  Respiratory:  Negative for cough, chest tightness, shortness of breath, wheezing and stridor.   Cardiovascular:  Negative for chest pain, palpitations and leg swelling.  Gastrointestinal:  Negative for abdominal distention, abdominal pain, anal bleeding, blood in stool, constipation, diarrhea, nausea and vomiting.  Genitourinary:  Negative for difficulty urinating, dysuria, flank pain and hematuria.  Musculoskeletal:  Negative for arthralgias, back pain, gait problem, joint swelling and myalgias.  Skin:  Negative for color change, pallor, rash  and wound.  Neurological:  Negative for dizziness, tremors, weakness and light-headedness.  Hematological:  Negative for adenopathy. Does not bruise/bleed easily.  Psychiatric/Behavioral:  Negative for agitation, behavioral problems, confusion, decreased concentration, dysphoric mood and sleep disturbance.        Objective:   Physical Exam Constitutional:      Appearance: He is well-developed.  HENT:     Head: Normocephalic and atraumatic.  Eyes:     Conjunctiva/sclera:  Conjunctivae normal.  Cardiovascular:     Rate and Rhythm: Normal rate and regular rhythm.  Pulmonary:     Effort: Pulmonary effort is normal. No respiratory distress.     Breath sounds: No wheezing.  Abdominal:     General: There is no distension.     Palpations: Abdomen is soft.  Musculoskeletal:        General: No tenderness. Normal range of motion.     Cervical back: Normal range of motion and neck supple.  Skin:    General: Skin is warm and dry.     Coloration: Skin is not pale.     Findings: No erythema or rash.  Neurological:     General: No focal deficit present.     Mental Status: He is alert and oriented to person, place, and time.  Psychiatric:        Mood and Affect: Mood normal.        Behavior: Behavior normal.        Thought Content: Thought content normal.        Judgment: Judgment normal.          Assessment & Plan:   HIV disease: I will recheck his HIV viral load, CD4 Cbc w diff CMP w GFR  Lab Results  Component Value Date   HIV1RNAQUANT <20 (H) 02/15/2022   Lab Results  Component Value Date   CD4TABS 461 08/01/2021   CD4TABS 412 04/18/2021   CD4TABS 232 (L) 08/07/2020   Pretension: Initiate amlodipine 5 mg we will see him in 2 months time and uptitrate this and try to get better control of his blood pressure chronic kidney disease creatinine is in range where it has been but again I think that this is likely due to his blood pressure not being optimally controlled  I will continue his Biktarvy  STI screening: I will screen him for GC and Chlamydia  and syphilis  Syphilis: RPR now nonreactive  Obesity: continue carbohydrate restriction

## 2022-08-07 NOTE — Addendum Note (Signed)
Addended by: Truman Hayward on: 08/07/2022 04:15 PM   Modules accepted: Orders

## 2022-08-08 ENCOUNTER — Telehealth: Payer: Self-pay

## 2022-08-08 LAB — URINE CYTOLOGY ANCILLARY ONLY
Chlamydia: NEGATIVE
Comment: NEGATIVE
Comment: NORMAL
Neisseria Gonorrhea: NEGATIVE

## 2022-08-08 LAB — T-HELPER CELLS (CD4) COUNT (NOT AT ARMC)
CD4 % Helper T Cell: 11 % — ABNORMAL LOW (ref 33–65)
CD4 T Cell Abs: 324 /uL — ABNORMAL LOW (ref 400–1790)

## 2022-08-08 NOTE — Telephone Encounter (Signed)
Spoke with Luis Vincent, discussed elevated kidney function. He will come in 9/11 to see Dr. Tommy Medal to repeat labs and check up on BP. He has not received his amlodipine from the mail order pharmacy yet.   He is curious about how to improve his kidney function. We discussed how his high BP is likely contributing. Encouraged adherence to amlodipine and hydration. Patient verbalized understanding and has no further questions.   Beryle Flock, RN

## 2022-08-08 NOTE — Telephone Encounter (Signed)
-----   Message from Truman Hayward, MD sent at 08/08/2022  4:22 PM EDT ----- Can we have him followup with me or partner in next few weeks his renal fxn continues to worsen and I expect from his poor BP control  ----- Message ----- From: Cheyenne Adas Lab Results In Sent: 08/07/2022  10:40 PM EDT To: Truman Hayward, MD

## 2022-08-10 LAB — CBC WITH DIFFERENTIAL/PLATELET
Absolute Monocytes: 405 cells/uL (ref 200–950)
Basophils Absolute: 70 cells/uL (ref 0–200)
Basophils Relative: 1.3 %
Eosinophils Absolute: 22 cells/uL (ref 15–500)
Eosinophils Relative: 0.4 %
HCT: 42.8 % (ref 38.5–50.0)
Hemoglobin: 14.7 g/dL (ref 13.2–17.1)
Lymphs Abs: 3035 cells/uL (ref 850–3900)
MCH: 29.8 pg (ref 27.0–33.0)
MCHC: 34.3 g/dL (ref 32.0–36.0)
MCV: 86.6 fL (ref 80.0–100.0)
MPV: 9.9 fL (ref 7.5–12.5)
Monocytes Relative: 7.5 %
Neutro Abs: 1868 cells/uL (ref 1500–7800)
Neutrophils Relative %: 34.6 %
Platelets: 295 10*3/uL (ref 140–400)
RBC: 4.94 10*6/uL (ref 4.20–5.80)
RDW: 13.2 % (ref 11.0–15.0)
Total Lymphocyte: 56.2 %
WBC: 5.4 10*3/uL (ref 3.8–10.8)

## 2022-08-10 LAB — LIPID PANEL
Cholesterol: 253 mg/dL — ABNORMAL HIGH (ref <200)
HDL: 31 mg/dL — ABNORMAL LOW (ref >=40)
LDL Cholesterol (Calc): 167 mg/dL (calc) — ABNORMAL HIGH
Non-HDL Cholesterol (Calc): 222 mg/dL (calc) — ABNORMAL HIGH (ref <130)
Total CHOL/HDL Ratio: 8.2 (calc) — ABNORMAL HIGH (ref <5.0)
Triglycerides: 327 mg/dL — ABNORMAL HIGH (ref <150)

## 2022-08-10 LAB — COMPLETE METABOLIC PANEL WITH GFR
AG Ratio: 1.4 (calc) (ref 1.0–2.5)
ALT: 26 U/L (ref 9–46)
AST: 23 U/L (ref 10–40)
Albumin: 4.2 g/dL (ref 3.6–5.1)
Alkaline phosphatase (APISO): 50 U/L (ref 36–130)
BUN/Creatinine Ratio: 10 (calc) (ref 6–22)
BUN: 18 mg/dL (ref 7–25)
CO2: 24 mmol/L (ref 20–32)
Calcium: 9.5 mg/dL (ref 8.6–10.3)
Chloride: 103 mmol/L (ref 98–110)
Creat: 1.86 mg/dL — ABNORMAL HIGH (ref 0.60–1.26)
Globulin: 3.1 g/dL (calc) (ref 1.9–3.7)
Glucose, Bld: 89 mg/dL (ref 65–99)
Potassium: 3.9 mmol/L (ref 3.5–5.3)
Sodium: 138 mmol/L (ref 135–146)
Total Bilirubin: 0.8 mg/dL (ref 0.2–1.2)
Total Protein: 7.3 g/dL (ref 6.1–8.1)
eGFR: 48 mL/min/{1.73_m2} — ABNORMAL LOW (ref >=60)

## 2022-08-10 LAB — RPR: RPR Ser Ql: NONREACTIVE

## 2022-08-10 LAB — HIV-1 RNA QUANT-NO REFLEX-BLD
HIV 1 RNA Quant: NOT DETECTED Copies/mL
HIV-1 RNA Quant, Log: NOT DETECTED Log cps/mL

## 2022-08-19 ENCOUNTER — Ambulatory Visit: Payer: Self-pay | Admitting: Infectious Disease

## 2022-08-21 ENCOUNTER — Ambulatory Visit: Payer: Self-pay | Admitting: Infectious Disease

## 2022-09-04 ENCOUNTER — Telehealth: Payer: Self-pay

## 2022-09-04 NOTE — Telephone Encounter (Signed)
Patient called stating his BP has been 185/116 and 166/108. Patient stated he is taking 10 mg of Amlodipine daily at night. Patient stated he does not have a pcp. Patient reported no SOB or chest pain. Patient advised if he develops any symptoms go to the ED. Per Dr. Tommy Medal patient should be seen.Patient scheduled with Dr. Candiss Norse tomorrow.

## 2022-09-05 ENCOUNTER — Encounter: Payer: Self-pay | Admitting: Internal Medicine

## 2022-09-05 ENCOUNTER — Ambulatory Visit (INDEPENDENT_AMBULATORY_CARE_PROVIDER_SITE_OTHER): Payer: Self-pay | Admitting: Internal Medicine

## 2022-09-05 ENCOUNTER — Other Ambulatory Visit: Payer: Self-pay

## 2022-09-05 VITALS — BP 146/86 | HR 74 | Resp 16 | Ht 71.0 in | Wt 302.0 lb

## 2022-09-05 DIAGNOSIS — R7989 Other specified abnormal findings of blood chemistry: Secondary | ICD-10-CM

## 2022-09-05 NOTE — Progress Notes (Signed)
[facility]      HPI: Luis Vincent is a 36 y.o. male with HIV on biktarvy. Presents for elevated BP on norvasc '10mg'$ . Per documentation his BP was 185/116 and 166/108. He had no signs of end organ failure. No SHOB, HA, vision changes.   Past Medical History:  Diagnosis Date   CKD (chronic kidney disease) 08/07/2022   Headache    Hemorrhoid 02/27/2022   HIV infection ([place])    HTN (hypertension) 08/07/2022   Immune deficiency disorder ([place])    Obesity 08/01/2021   Routine screening for STI (sexually transmitted infection) 08/07/2022   Sleepiness 08/01/2021   Syphilis    Vaccine counseling 08/07/2022    No past surgical history on file.  No family history on file. Current Outpatient Medications on File Prior to Visit  Medication Sig Dispense Refill   amLODipine (NORVASC) 10 MG tablet Take 1 tablet (10 mg total) by mouth daily. 30 tablet 11   bictegravir-emtricitabine-tenofovir AF (BIKTARVY) 50-200-25 MG TABS tablet Take 1 tablet by mouth daily. 30 tablet 11   benzonatate (TESSALON) 100 MG capsule Take 1 capsule (100 mg total) by mouth every 8 (eight) hours. (Patient not taking: Reported on 02/27/2022) 21 capsule 0   diphenhydrAMINE (BENADRYL) 25 MG tablet Take 1 tablet (25 mg total) by mouth every 6 (six) hours as needed. (Patient not taking: Reported on 08/07/2022) 30 tablet 0   famotidine (PEPCID) 20 MG tablet Take 1 tablet (20 mg total) by mouth 2 (two) times daily as needed (rash, itching). (Patient not taking: Reported on 08/07/2022) 15 tablet 0   fluticasone (FLONASE) 50 MCG/ACT nasal spray Place 1 spray into both nostrils daily. (Patient not taking: Reported on 02/27/2022) 11.1 mL 0   No current facility-administered medications on file prior to visit.    No Known Allergies  Lab Results HIV 1 RNA Quant (Copies/mL)  Date Value  08/07/2022 Not Detected  02/15/2022 <20 (H)  08/01/2021 <20 (H)   CD4 T Cell Abs (/uL)  Date Value  08/07/2022  324 (L)  08/01/2021 461  04/18/2021 412   Lab Results  Component Value Date   HIV1GENOSEQ  RT, Seq^REPORT 02/17/2014   Lab Results  Component Value Date   WBC 5.4 08/07/2022   HGB 14.7 08/07/2022   HCT 42.8 08/07/2022   MCV 86.6 08/07/2022   PLT 295 08/07/2022    Lab Results  Component Value Date   CREATININE 1.86 (H) 08/07/2022   BUN 18 08/07/2022   NA 138 08/07/2022   K 3.9 08/07/2022   CL 103 08/07/2022   CO2 24 08/07/2022   Lab Results  Component Value Date   ALT 26 08/07/2022   AST 23 08/07/2022   ALKPHOS 53 11/03/2019   BILITOT 0.8 08/07/2022    Lab Results  Component Value Date   CHOL 253 (H) 08/07/2022   TRIG 327 (H) 08/07/2022   HDL 31 (L) 08/07/2022   LDLCALC 167 (H) 08/07/2022   Lab Results  Component Value Date   HAV BORDERLINE (A) 02/17/2014   Lab Results  Component Value Date   HEPBSAG NEGATIVE 02/17/2014   HEPBSAB POS (A) 02/17/2014   Lab Results  Component Value Date   HCVAB NEGATIVE 02/05/2016   Lab Results  Component Value Date   CHLAMYDIAWP Negative 08/07/2022   N Negative 08/07/2022   No results found for: "GCPROBEAPT" No results found for: "QUANTGOLD"  Plan #HTN on Norvasc '10mg'$  #CKD III #Obesity -His  BP in clinic was 146/86. I discussed with him in depth about need for PCP given his multiple medical  issues. He is amenable to seeing a PCP.  Plan -Refer to internal medicine given HTN, CKDIII and obesity. -Will get CMP as last Scr(1.86) was above baseline ( around 1.5-1.6) -Follow-up in one month with Dr. Drucilla Schmidt  #HIV-Asymptomatic -CD$ 324, VL ND on 08/07/22 -Continue Biktarvy  Laurice Record, MD [facility]

## 2022-09-06 LAB — COMPLETE METABOLIC PANEL WITH GFR
AG Ratio: 1.3 (calc) (ref 1.0–2.5)
ALT: 51 U/L — ABNORMAL HIGH (ref 9–46)
AST: 26 U/L (ref 10–40)
Albumin: 4.3 g/dL (ref 3.6–5.1)
Alkaline phosphatase (APISO): 53 U/L (ref 36–130)
BUN/Creatinine Ratio: 14 (calc) (ref 6–22)
BUN: 24 mg/dL (ref 7–25)
CO2: 27 mmol/L (ref 20–32)
Calcium: 9.4 mg/dL (ref 8.6–10.3)
Chloride: 102 mmol/L (ref 98–110)
Creat: 1.67 mg/dL — ABNORMAL HIGH (ref 0.60–1.26)
Globulin: 3.2 g/dL (calc) (ref 1.9–3.7)
Glucose, Bld: 88 mg/dL (ref 65–99)
Potassium: 4.4 mmol/L (ref 3.5–5.3)
Sodium: 136 mmol/L (ref 135–146)
Total Bilirubin: 0.6 mg/dL (ref 0.2–1.2)
Total Protein: 7.5 g/dL (ref 6.1–8.1)
eGFR: 54 mL/min/{1.73_m2} — ABNORMAL LOW (ref >=60)

## 2022-09-09 ENCOUNTER — Telehealth: Payer: Self-pay

## 2022-09-09 NOTE — Telephone Encounter (Signed)
-----   Message from Laurice Record, MD sent at 09/09/2022  9:46 AM EDT ----- Stable labs

## 2022-09-09 NOTE — Telephone Encounter (Signed)
Patient advised of lab results and verbalized understanding. Patient had no questions Luis Vincent  

## 2022-09-16 ENCOUNTER — Encounter: Payer: Self-pay | Admitting: Nurse Practitioner

## 2022-09-16 ENCOUNTER — Ambulatory Visit (INDEPENDENT_AMBULATORY_CARE_PROVIDER_SITE_OTHER): Payer: Self-pay | Admitting: Nurse Practitioner

## 2022-09-16 VITALS — BP 144/92 | HR 82 | Temp 98.2°F | Ht 69.5 in | Wt 300.0 lb

## 2022-09-16 DIAGNOSIS — E785 Hyperlipidemia, unspecified: Secondary | ICD-10-CM | POA: Insufficient documentation

## 2022-09-16 DIAGNOSIS — B20 Human immunodeficiency virus [HIV] disease: Secondary | ICD-10-CM

## 2022-09-16 DIAGNOSIS — E782 Mixed hyperlipidemia: Secondary | ICD-10-CM

## 2022-09-16 DIAGNOSIS — R7303 Prediabetes: Secondary | ICD-10-CM

## 2022-09-16 DIAGNOSIS — Z6841 Body Mass Index (BMI) 40.0 and over, adult: Secondary | ICD-10-CM

## 2022-09-16 DIAGNOSIS — Z2821 Immunization not carried out because of patient refusal: Secondary | ICD-10-CM

## 2022-09-16 DIAGNOSIS — Z0000 Encounter for general adult medical examination without abnormal findings: Secondary | ICD-10-CM

## 2022-09-16 DIAGNOSIS — I10 Essential (primary) hypertension: Secondary | ICD-10-CM

## 2022-09-16 DIAGNOSIS — N1831 Chronic kidney disease, stage 3a: Secondary | ICD-10-CM

## 2022-09-16 DIAGNOSIS — G4733 Obstructive sleep apnea (adult) (pediatric): Secondary | ICD-10-CM

## 2022-09-16 LAB — POCT URINALYSIS DIPSTICK
Bilirubin, UA: NEGATIVE
Blood, UA: NEGATIVE
Glucose, UA: NEGATIVE
Ketones, UA: NEGATIVE
Nitrite, UA: NEGATIVE
Protein, UA: NEGATIVE
Spec Grav, UA: >=1.03 — AB (ref 1.010–1.025)
Urobilinogen, UA: 0.2 E.U./dL
pH, UA: 5 (ref 5.0–8.0)

## 2022-09-16 LAB — POCT GLYCOSYLATED HEMOGLOBIN (HGB A1C): Hemoglobin A1C: 5.8 % — AB (ref 4.0–5.6)

## 2022-09-16 MED ORDER — LISINOPRIL 5 MG PO TABS: 5.0000 mg | ORAL_TABLET | Freq: Every day | ORAL | 0 refills | Status: DC

## 2022-09-16 NOTE — Assessment & Plan Note (Signed)
Continue CPAP at bedtime. Lose weight.

## 2022-09-16 NOTE — Assessment & Plan Note (Signed)
Lab Results  Component Value Date   NA 136 09/05/2022   K 4.4 09/05/2022   CO2 27 09/05/2022   GLUCOSE 88 09/05/2022   BUN 24 09/05/2022   CREATININE 1.67 (H) 09/05/2022   CALCIUM 9.4 09/05/2022   EGFR 54 (L) 09/05/2022   GFRNONAA 54 (L) 04/18/2021  avoid NSAIDs and other nephrotoxic agents  Start lisinopril 68m daily for uncontrolled HTN.  Drink at least 64 ounces of water daily to maintain hydration.

## 2022-09-16 NOTE — Assessment & Plan Note (Signed)
Followed by Dr. Elna Breslow is at [facility] for infectious disease.Follws up every 3 months .  Currently on Biktarvy 50 - 200 - 25 mg 1 tablet daily. Patient encouraged to maintain close follow up with ID team.

## 2022-09-16 NOTE — Progress Notes (Signed)
 New Patient Office Visit  Subjective:  Patient ID: Luis Vincent, male    DOB: 05/19/1986  Age: 36 y.o. 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 He is on 10 mg amlodipine daily. Denies headache, blurry vision, SOB and chest pain.     HPI Luis Vincent is a 36 y.o. male with past medical history of hypertension, CKD, HIV disease, obesity, obstructive sleep apnea presents for establishing care for his chronic medical conditions.  No previous PCP   Hypertension.  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  Patient reports compliance with medication. He denies dizziness, chest pain, edema.  States that he has been on amlodipine for about a month now.   OSA. Wears  CPAP at home for sleep apnea  HIV disease.  Followed by Dr. Calone is at [facility] for infectious disease.Follws up every 3 months .  Currently on Biktarvy 50 - 200 - 25 mg 1 tablet daily.  Due for Tdap vaccine today flu vaccine need for both vaccines discussed with patient he refused flu vaccine today.  Patient encouraged to consider get both vaccines he verbalized understanding.  Past Medical History:  Diagnosis Date   CKD (chronic kidney disease) 08/07/2022   Headache    Hemorrhoid 02/27/2022   HIV infection ([facility])    HTN (hypertension) 08/07/2022   Immune deficiency disorder ([facility])    Obesity 08/01/2021   Routine screening for STI (sexually transmitted infection) 08/07/2022   Sleepiness 08/01/2021   Syphilis    Vaccine counseling 08/07/2022    No past surgical history on file.  Family History  Problem Relation Age of Onset   Lung cancer Father    Emphysema Father    Hypertension Sister    Hypertension Maternal Grandmother     Social History   Socioeconomic History   Marital status: Single    Spouse name: Not on file   Number of  children: Not on file   Years of education: Not on file   Highest education level: Not on file  Occupational History   Not on file  Tobacco Use   Smoking status: Never   Smokeless tobacco: Never  Substance and Sexual Activity   Alcohol use: No   Drug use: No    Comment: declined condoms   Sexual activity: Not Currently    Comment: declined condoms  Other Topics Concern   Not on file  Social History Narrative   Works as a truck driver.    Social Determinants of Health   Financial Resource Strain: Not on file  Food Insecurity: Not on file  Transportation Needs: Not on file  Physical Activity: Not on file  Stress: Not on file  Social Connections: Not on file  Intimate Partner Violence: Not on file    ROS Review of Systems  Constitutional: Negative.  Negative for activity change, appetite change, chills and diaphoresis.  HENT: Negative.    Respiratory:  Positive for apnea. Negative for cough, choking, shortness of breath, wheezing and stridor.   Cardiovascular: Negative.  Negative for chest pain, palpitations and leg swelling.  Gastrointestinal: Negative.  Negative for abdominal distention, abdominal pain and anal bleeding.  Neurological:  Negative for dizziness, seizures, facial asymmetry, light-headedness, numbness and headaches.  Psychiatric/Behavioral: Negative.  Negative for agitation, behavioral problems, confusion, decreased   concentration and dysphoric mood.     Objective:   Today's Vitals: BP (!) 144/92   Pulse 82   Temp 98.2 F (36.8 C) (Oral)   Ht 5' 9.5" (1.765 m)   Wt 300 lb (136.1 kg)   SpO2 98%   BMI 43.67 kg/m   Physical Exam Constitutional:      General: He is not in acute distress.    Appearance: Normal appearance. He is obese. He is not ill-appearing, toxic-appearing or diaphoretic.  Eyes:     General: No scleral icterus.       Right eye: No discharge.        Left eye: No discharge.     Extraocular Movements: Extraocular movements intact.      Pupils: Pupils are equal, round, and reactive to light.  Cardiovascular:     Rate and Rhythm: Normal rate and regular rhythm.     Pulses: Normal pulses.     Heart sounds: Normal heart sounds. No murmur heard.    No friction rub. No gallop.  Pulmonary:     Effort: Pulmonary effort is normal. No respiratory distress.     Breath sounds: Normal breath sounds. No stridor. No wheezing, rhonchi or rales.  Chest:     Chest wall: No tenderness.  Abdominal:     General: There is no distension.     Palpations: Abdomen is soft.     Tenderness: There is no abdominal tenderness.  Musculoskeletal:        General: No swelling or deformity.     Right lower leg: No edema.  Skin:    Capillary Refill: Capillary refill takes less than 2 seconds.     Coloration: Skin is not jaundiced or pale.  Neurological:     Mental Status: He is alert and oriented to person, place, and time.     Cranial Nerves: No cranial nerve deficit.     Sensory: No sensory deficit.     Motor: No weakness.     Gait: Gait normal.  Psychiatric:        Mood and Affect: Mood normal.        Behavior: Behavior normal.        Thought Content: Thought content normal.        Judgment: Judgment normal.     Assessment & Plan:   Problem List Items Addressed This Visit       Cardiovascular and Mediastinum   HTN (hypertension) - Primary    BP Readings from Last 3 Encounters:  09/16/22 (!) 144/92  09/05/22 (!) 146/86  08/07/22 (!) 155/102  currently on amlodipine 29m daily  Start lisinopri 539mdaily , continue amlodipine 1063maily  DASH diet advised, engage in regular moderate to vigorous exercises at least 150 minutes daily  BMP in 2 weeks  follow up in 4 weeks.       Relevant Medications   lisinopril (ZESTRIL) 5 MG tablet   Other Relevant Orders   Basic Metabolic Panel     Respiratory   OSA (obstructive sleep apnea)    Continue CPAP at bedtime. Lose weight.        Genitourinary   CKD (chronic kidney disease)     Lab Results  Component Value Date   NA 136 09/05/2022   K 4.4 09/05/2022   CO2 27 09/05/2022   GLUCOSE 88 09/05/2022   BUN 24 09/05/2022   CREATININE 1.67 (H) 09/05/2022   CALCIUM 9.4 09/05/2022   EGFR 54 (L) 09/05/2022   GFRNONAA  54 (L) 04/18/2021  avoid NSAIDs and other nephrotoxic agents  Start lisinopril 5mg daily for uncontrolled HTN.  Drink at least 64 ounces of water daily to maintain hydration.         Other   HIV disease (HCC)    Followed by Dr. Calone is at [facility].Follws up every 3 months .  Currently on Biktarvy 50 - 200 - 25 mg 1 tablet daily. Patient encouraged to maintain close follow up with ID team.       Obesity    Wt Readings from Last 3 Encounters:  09/16/22 300 lb (136.1 kg)  09/05/22 (!) 302 lb (137 kg)  08/07/22 293 lb (132.9 kg)  patient counseled on low carb modified diet.  Need to increase intake of whole foods consisting mainly vegetables and protein less carbohydrate drinking at least 64 ounces of water daily engaging in regular moderate to vigorous exercises at least 150 minutes weekly importance of portion control also discussed.  Benefits of healthy weight discussed with the patient he verbalized understanding      Relevant Orders   POCT urinalysis dipstick (Completed)   POCT glycosylated hemoglobin (Hb A1C) (Completed)   Hyperlipidemia    Lab Results  Component Value Date   CHOL 253 (H) 08/07/2022   HDL 31 (L) 08/07/2022   LDLCALC 167 (H) 08/07/2022   TRIG 327 (H) 08/07/2022   CHOLHDL 8.2 (H) 08/07/2022  Avoid fatty fried foods Lose weight We will recheck fasting lipid panel at next visit       Relevant Medications   lisinopril (ZESTRIL) 5 MG tablet   Refused influenza vaccine   Prediabetes    Lab Results  Component Value Date   HGBA1C 5.8 (A) 09/16/2022  Avoid sugar , sweets , soda      Other Visit Diagnoses     Health care maintenance       Relevant Orders   POCT urinalysis  dipstick (Completed)   POCT glycosylated hemoglobin (Hb A1C) (Completed)       Outpatient Encounter Medications as of 09/16/2022  Medication Sig   amLODipine (NORVASC) 10 MG tablet Take 1 tablet (10 mg total) by mouth daily.   bictegravir-emtricitabine-tenofovir AF (BIKTARVY) 50-200-25 MG TABS tablet Take 1 tablet by mouth daily.   lisinopril (ZESTRIL) 5 MG tablet Take 1 tablet (5 mg total) by mouth daily.   [DISCONTINUED] benzonatate (TESSALON) 100 MG capsule Take 1 capsule (100 mg total) by mouth every 8 (eight) hours. (Patient not taking: Reported on 02/27/2022)   [DISCONTINUED] diphenhydrAMINE (BENADRYL) 25 MG tablet Take 1 tablet (25 mg total) by mouth every 6 (six) hours as needed. (Patient not taking: Reported on 08/07/2022)   [DISCONTINUED] famotidine (PEPCID) 20 MG tablet Take 1 tablet (20 mg total) by mouth 2 (two) times daily as needed (rash, itching). (Patient not taking: Reported on 08/07/2022)   [DISCONTINUED] fluticasone (FLONASE) 50 MCG/ACT nasal spray Place 1 spray into both nostrils daily. (Patient not taking: Reported on 02/27/2022)   [DISCONTINUED] ibuprofen (IBU) 800 MG tablet Take 800 mg by mouth every 8 (eight) hours as needed.   No facility-administered encounter medications on file as of 09/16/2022.    Follow-up: Return in about 4 weeks (around 10/14/2022) for f/u for BP, CPE .   Folashade R Paseda, FNP 

## 2022-09-16 NOTE — Assessment & Plan Note (Addendum)
BP Readings from Last 3 Encounters:  09/16/22 (!) 144/92  09/05/22 (!) 146/86  08/07/22 (!) 155/102  currently on amlodipine '10mg'$  daily  Start lisinopri '5mg'$  daily , continue amlodipine '10mg'$  daily  DASH diet advised, engage in regular moderate to vigorous exercises at least 150 minutes daily  BMP in 2 weeks  follow up in 4 weeks.

## 2022-09-16 NOTE — Assessment & Plan Note (Signed)
Lab Results  Component Value Date   HGBA1C 5.8 (A) 09/16/2022  Avoid sugar , sweets , soda

## 2022-09-16 NOTE — Assessment & Plan Note (Addendum)
Wt Readings from Last 3 Encounters:  09/16/22 300 lb (136.1 kg)  09/05/22 (!) 302 lb (137 kg)  08/07/22 293 lb (132.9 kg)  patient counseled on low carb modified diet.  Need to increase intake of whole foods consisting mainly vegetables and protein less carbohydrate drinking at least 64 ounces of water daily engaging in regular moderate to vigorous exercises at least 150 minutes weekly importance of portion control also discussed.  Benefits of healthy weight discussed with the patient he verbalized understanding

## 2022-09-16 NOTE — Assessment & Plan Note (Signed)
Lab Results  Component Value Date   CHOL 253 (H) 08/07/2022   HDL 31 (L) 08/07/2022   LDLCALC 167 (H) 08/07/2022   TRIG 327 (H) 08/07/2022   CHOLHDL 8.2 (H) 08/07/2022  Avoid fatty fried foods Lose weight We will recheck fasting lipid panel at next visit

## 2022-09-16 NOTE — Patient Instructions (Addendum)
Please start taking lisinopril '5mg'$  daily for your hypertension, continue amlodipine '10mg'$  daily. Labs in 2 weeks   Around 3 times per week, check your blood pressure 2 times per day. once in the morning and once in the evening. The readings should be at least one minute apart. Write down these values and bring them to your next nurse visit/appointment.  When you check your BP, make sure you have been doing something calm/relaxing 5 minutes prior to checking. Both feet should be flat on the floor and you should be sitting. Use your left arm and make sure it is in a relaxed position (on a table), and that the cuff is at the approximate level/height of your heart.   It is important that you exercise regularly at least 30 minutes 5 times a week as tolerated  Think about what you will eat, plan ahead. Choose " clean, green, fresh or frozen" over canned, processed or packaged foods which are more sugary, salty and fatty. 70 to 75% of food eaten should be vegetables and fruit. Three meals at set times with snacks allowed between meals, but they must be fruit or vegetables. Aim to eat over a 12 hour period , example 7 am to 7 pm, and STOP after  your last meal of the day. Drink water,generally about 64 ounces per day, no other drink is as healthy. Fruit juice is best enjoyed in a healthy way, by EATING the fruit.  Thanks for choosing [facility] we consider it a privelige to serve you.

## 2022-09-23 ENCOUNTER — Encounter: Payer: Self-pay | Admitting: Infectious Disease

## 2022-09-23 ENCOUNTER — Other Ambulatory Visit: Payer: Self-pay

## 2022-09-23 ENCOUNTER — Ambulatory Visit (INDEPENDENT_AMBULATORY_CARE_PROVIDER_SITE_OTHER): Payer: Self-pay | Admitting: Infectious Disease

## 2022-09-23 VITALS — BP 141/94 | HR 75 | Temp 98.5°F | Ht 71.0 in | Wt 304.0 lb

## 2022-09-23 DIAGNOSIS — Z7185 Encounter for immunization safety counseling: Secondary | ICD-10-CM

## 2022-09-23 DIAGNOSIS — A5149 Other secondary syphilitic conditions: Secondary | ICD-10-CM

## 2022-09-23 DIAGNOSIS — N183 Chronic kidney disease, stage 3 unspecified: Secondary | ICD-10-CM

## 2022-09-23 DIAGNOSIS — B20 Human immunodeficiency virus [HIV] disease: Secondary | ICD-10-CM

## 2022-09-23 DIAGNOSIS — I10 Essential (primary) hypertension: Secondary | ICD-10-CM

## 2022-09-23 MED ORDER — BIKTARVY 50-200-25 MG PO TABS: 1.0000 | ORAL_TABLET | Freq: Every day | ORAL | 11 refills | Status: DC

## 2022-09-23 MED ORDER — LISINOPRIL 10 MG PO TABS: 10.0000 mg | ORAL_TABLET | Freq: Every day | ORAL | 11 refills | Status: DC

## 2022-09-23 NOTE — Progress Notes (Signed)
Subjective:  Chief complaint: For HIV disease on medications and follow-up for blood pressure management Patient ID: Luis Vincent, male    DOB: 18-Jan-1986, 36 y.o.   MRN: [id number]  HPI  36 year old black man living with HIV that has been well controlled recently on Biktarvy to which she has been highly adherent.  Luis Vincent does need to renew his eye Program and [employer] which we will do today we will also get repeat labs blood pressure remains elevated and I suspect this may be contributing to his chronic kidney disease.  He followed up my partner Dr. Candiss Norse and then was referred to primary care which I had not realized when I began the visit with him.  He had 5 mg of lisinopril added to his amlodipine 10 mg by Dr. Lillie Columbia and I today increased this to '10mg'$   His BP is much better controlled      Past Medical History:  Diagnosis Date   CKD (chronic kidney disease) 08/07/2022   Headache    Hemorrhoid 02/27/2022   HIV infection (H[facility])    HTN (hypertension) 08/07/2022   Immune deficiency disorder ([place])    Obesity 08/01/2021   Routine screening for STI (sexually transmitted infection) 08/07/2022   Sleepiness 08/01/2021   Syphilis    Vaccine counseling 08/07/2022    No past surgical history on file.  Family History  Problem Relation Age of Onset   Lung cancer Father    Emphysema Father    Hypertension Sister    Hypertension Maternal Grandmother       Social History   Socioeconomic History   Marital status: Single    Spouse name: Not on file   Number of children: Not on file   Years of education: Not on file   Highest education level: Not on file  Occupational History   Not on file  Tobacco Use   Smoking status: Never   Smokeless tobacco: Never  Substance and Sexual Activity   Alcohol use: No   Drug use: No    Comment: declined condoms   Sexual activity: Not Currently    Comment: declined condoms  Other Topics Concern   Not on file  Social History Narrative    Works as a Administrator.    Social Determinants of Health   Financial Resource Strain: Not on file  Food Insecurity: Not on file  Transportation Needs: Not on file  Physical Activity: Not on file  Stress: Not on file  Social Connections: Not on file    No Known Allergies   Current Outpatient Medications:    amLODipine (NORVASC) 10 MG tablet, Take 1 tablet (10 mg total) by mouth daily., Disp: 30 tablet, Rfl: 11   bictegravir-emtricitabine-tenofovir AF (BIKTARVY) 50-200-25 MG TABS tablet, Take 1 tablet by mouth daily., Disp: 30 tablet, Rfl: 11   lisinopril (ZESTRIL) 5 MG tablet, Take 1 tablet (5 mg total) by mouth daily., Disp: 60 tablet, Rfl: 0    Review of Systems  Constitutional:  Negative for activity change, appetite change, chills, diaphoresis, fatigue, fever and unexpected weight change.  HENT:  Negative for congestion, rhinorrhea, sinus pressure, sneezing, sore throat and trouble swallowing.   Eyes:  Negative for photophobia and visual disturbance.  Respiratory:  Negative for cough, chest tightness, shortness of breath, wheezing and stridor.   Cardiovascular:  Negative for chest pain, palpitations and leg swelling.  Gastrointestinal:  Negative for abdominal distention, abdominal pain, anal bleeding, blood in stool, constipation, diarrhea, nausea and vomiting.  Genitourinary:  Negative for difficulty urinating, dysuria, flank pain and hematuria.  Musculoskeletal:  Negative for arthralgias, back pain, gait problem, joint swelling and myalgias.  Skin:  Negative for color change, pallor, rash and wound.  Neurological:  Negative for dizziness, tremors, weakness and light-headedness.  Hematological:  Negative for adenopathy. Does not bruise/bleed easily.  Psychiatric/Behavioral:  Negative for agitation, behavioral problems, confusion, decreased concentration, dysphoric mood and sleep disturbance.        Objective:   Physical Exam Constitutional:      Appearance: He is  well-developed.  HENT:     Head: Normocephalic and atraumatic.  Eyes:     Conjunctiva/sclera: Conjunctivae normal.  Cardiovascular:     Rate and Rhythm: Normal rate and regular rhythm.  Pulmonary:     Effort: Pulmonary effort is normal. No respiratory distress.     Breath sounds: No wheezing.  Abdominal:     General: There is no distension.     Palpations: Abdomen is soft.  Musculoskeletal:        General: No tenderness. Normal range of motion.     Cervical back: Normal range of motion and neck supple.  Skin:    General: Skin is warm and dry.     Coloration: Skin is not pale.     Findings: No erythema or rash.  Neurological:     Mental Status: He is alert and oriented to person, place, and time.  Psychiatric:        Mood and Affect: Mood normal.        Behavior: Behavior normal.        Thought Content: Thought content normal.        Judgment: Judgment normal.          Assessment & Plan:   HIV disease:  I will add order HIV viral load CD4 count CBC with differential CMP, RPR GC and chlamydia and I will continue  Luis Vincent's Biktarvy  prescription    Hypertension:  We will continue Norvasc 10 mg and increase his lip senna pill to 10 mg I will check a metabolic panel today and he is going to see his PCP in November at which point he can have his be BMP rechecked and blood pressure checked.  Now that she is taken over his PCP I will remove myself from trying to manage his hypertension.  Cardiovascular risk in [place]: Note dated was a 37 and above with HIV can discuss with him at next visit  Vaccine counseling recommended flu shot which he received   Vaccine counselin:

## 2022-09-24 LAB — T-HELPER CELLS (CD4) COUNT (NOT AT ARMC)
CD4 % Helper T Cell: 19 % — ABNORMAL LOW (ref 33–65)
CD4 T Cell Abs: 647 /uL (ref 400–1790)

## 2022-09-25 LAB — CBC WITH DIFFERENTIAL/PLATELET
Absolute Monocytes: 622 cells/uL (ref 200–950)
Basophils Absolute: 67 cells/uL (ref 0–200)
Basophils Relative: 0.9 %
Eosinophils Absolute: 400 cells/uL (ref 15–500)
Eosinophils Relative: 5.4 %
HCT: 43.2 % (ref 38.5–50.0)
Hemoglobin: 14.6 g/dL (ref 13.2–17.1)
Lymphs Abs: 3493 cells/uL (ref 850–3900)
MCH: 30.1 pg (ref 27.0–33.0)
MCHC: 33.8 g/dL (ref 32.0–36.0)
MCV: 89.1 fL (ref 80.0–100.0)
MPV: 10.1 fL (ref 7.5–12.5)
Monocytes Relative: 8.4 %
Neutro Abs: 2819 cells/uL (ref 1500–7800)
Neutrophils Relative %: 38.1 %
Platelets: 316 10*3/uL (ref 140–400)
RBC: 4.85 10*6/uL (ref 4.20–5.80)
RDW: 13.8 % (ref 11.0–15.0)
Total Lymphocyte: 47.2 %
WBC: 7.4 10*3/uL (ref 3.8–10.8)

## 2022-09-25 LAB — COMPLETE METABOLIC PANEL WITH GFR
AG Ratio: 1.5 (calc) (ref 1.0–2.5)
ALT: 26 U/L (ref 9–46)
AST: 23 U/L (ref 10–40)
Albumin: 4.4 g/dL (ref 3.6–5.1)
Alkaline phosphatase (APISO): 63 U/L (ref 36–130)
BUN/Creatinine Ratio: 10 (calc) (ref 6–22)
BUN: 17 mg/dL (ref 7–25)
CO2: 26 mmol/L (ref 20–32)
Calcium: 9.3 mg/dL (ref 8.6–10.3)
Chloride: 104 mmol/L (ref 98–110)
Creat: 1.77 mg/dL — ABNORMAL HIGH (ref 0.60–1.26)
Globulin: 3 g/dL (calc) (ref 1.9–3.7)
Glucose, Bld: 82 mg/dL (ref 65–99)
Potassium: 4.3 mmol/L (ref 3.5–5.3)
Sodium: 137 mmol/L (ref 135–146)
Total Bilirubin: 0.6 mg/dL (ref 0.2–1.2)
Total Protein: 7.4 g/dL (ref 6.1–8.1)
eGFR: 50 mL/min/{1.73_m2} — ABNORMAL LOW (ref >=60)

## 2022-09-25 LAB — HIV-1 RNA QUANT-NO REFLEX-BLD
HIV 1 RNA Quant: <20 Copies/mL — ABNORMAL HIGH
HIV-1 RNA Quant, Log: <1.3 Log cps/mL — ABNORMAL HIGH

## 2022-10-14 ENCOUNTER — Ambulatory Visit (INDEPENDENT_AMBULATORY_CARE_PROVIDER_SITE_OTHER): Payer: Self-pay | Admitting: Nurse Practitioner

## 2022-10-14 ENCOUNTER — Encounter: Payer: Self-pay | Admitting: Nurse Practitioner

## 2022-10-14 VITALS — BP 139/87 | HR 72 | Temp 98.2°F | Ht 71.0 in | Wt 304.2 lb

## 2022-10-14 DIAGNOSIS — I10 Essential (primary) hypertension: Secondary | ICD-10-CM

## 2022-10-14 NOTE — Progress Notes (Signed)
Established Patient Office Visit  Subjective:  Patient ID: Luis Vincent, male    DOB: Nov 18, 1986  Age: 36 y.o. MRN: [id number]  CC:  Chief Complaint  Patient presents with   Follow-up    B/p follow up    HPI Luis Vincent is a 36 y.o. male with past medical history of hypertension, hyperlipidemia, CKD, obesity who presents for follow-up for hypertension  Hypertesnion.  Currently taking amlodipine 10 mg daily, lisinopril was increased to 10 mg daily during his last visit to the infectious disease specialist but the patient has been taking lisinopril 5 mg instead.  Patient denies chest pain, dizziness, edema.  Past Medical History:  Diagnosis Date   CKD (chronic kidney disease) 08/07/2022   Headache    Hemorrhoid 02/27/2022   HIV infection ([place])    HTN (hypertension) 08/07/2022   Immune deficiency disorder ([place])    Obesity 08/01/2021   Routine screening for STI (sexually transmitted infection) 08/07/2022   Sleepiness 08/01/2021   Syphilis    Vaccine counseling 08/07/2022    No past surgical history on file.  Family History  Problem Relation Age of Onset   Lung cancer Father    Emphysema Father    Hypertension Sister    Hypertension Maternal Grandmother     Social History   Socioeconomic History   Marital status: Single    Spouse name: Not on file   Number of children: Not on file   Years of education: Not on file   Highest education level: Not on file  Occupational History   Not on file  Tobacco Use   Smoking status: Never   Smokeless tobacco: Never  Substance and Sexual Activity   Alcohol use: No   Drug use: No    Comment: declined condoms   Sexual activity: Not Currently    Comment: declined condoms  Other Topics Concern   Not on file  Social History Narrative   Works as a Administrator.    Social Determinants of Health   Financial Resource Strain: Not on file  Food Insecurity: Not on file  Transportation Needs: Not on file  Physical Activity:  Not on file  Stress: Not on file  Social Connections: Not on file  Intimate Partner Violence: Not on file    Outpatient Medications Prior to Visit  Medication Sig Dispense Refill   amLODipine (NORVASC) 10 MG tablet Take 1 tablet (10 mg total) by mouth daily. 30 tablet 11   bictegravir-emtricitabine-tenofovir AF (BIKTARVY) 50-200-25 MG TABS tablet Take 1 tablet by mouth daily. 30 tablet 11   lisinopril (PRINIVIL) 10 MG tablet Take 1 tablet (10 mg total) by mouth daily. 30 tablet 11   No facility-administered medications prior to visit.    No Known Allergies  ROS Review of Systems  Constitutional: Negative.  Negative for activity change, appetite change and chills.  HENT: Negative.    Respiratory:  Negative for apnea, cough, chest tightness, shortness of breath and wheezing.   Cardiovascular: Negative.  Negative for chest pain, palpitations and leg swelling.  Gastrointestinal: Negative.  Negative for abdominal distention and abdominal pain.  Neurological: Negative.  Negative for dizziness, facial asymmetry, light-headedness and headaches.  Psychiatric/Behavioral: Negative.  Negative for agitation, behavioral problems and confusion.       Objective:    Physical Exam Constitutional:      General: He is not in acute distress.    Appearance: He is obese. He is not ill-appearing, toxic-appearing or diaphoretic.  Eyes:  General: No scleral icterus.       Right eye: No discharge.        Left eye: No discharge.     Extraocular Movements: Extraocular movements intact.     Conjunctiva/sclera: Conjunctivae normal.     Pupils: Pupils are equal, round, and reactive to light.  Cardiovascular:     Pulses: Normal pulses.     Heart sounds: Normal heart sounds. No murmur heard.    No friction rub. No gallop.  Pulmonary:     Effort: Pulmonary effort is normal. No respiratory distress.     Breath sounds: Normal breath sounds. No stridor. No wheezing, rhonchi or rales.  Chest:     Chest  wall: No tenderness.  Musculoskeletal:        General: No swelling, tenderness, deformity or signs of injury.     Right lower leg: No edema.     Left lower leg: No edema.  Skin:    Capillary Refill: Capillary refill takes less than 2 seconds.     Coloration: Skin is not jaundiced.     Findings: No bruising or lesion.  Neurological:     Mental Status: He is alert.  Psychiatric:        Mood and Affect: Mood normal.        Behavior: Behavior normal.        Thought Content: Thought content normal.        Judgment: Judgment normal.     BP 139/87   Pulse 72   Temp 98.2 F (36.8 C)   Ht _0  (1.803 m)   Wt (!) 304 lb 3.2 oz (138 kg)   SpO2 100%   BMI 42.43 kg/m  Wt Readings from Last 3 Encounters:  10/14/22 (!) 304 lb 3.2 oz (138 kg)  09/23/22 (!) 304 lb (137.9 kg)  09/16/22 300 lb (136.1 kg)    No results found for: "TSH" Lab Results  Component Value Date   WBC 7.4 09/23/2022   HGB 14.6 09/23/2022   HCT 43.2 09/23/2022   MCV 89.1 09/23/2022   PLT 316 09/23/2022   Lab Results  Component Value Date   NA 137 09/23/2022   K 4.3 09/23/2022   CO2 26 09/23/2022   GLUCOSE 82 09/23/2022   BUN 17 09/23/2022   CREATININE 1.77 (H) 09/23/2022   BILITOT 0.6 09/23/2022   ALKPHOS 53 11/03/2019   AST 23 09/23/2022   ALT 26 09/23/2022   PROT 7.4 09/23/2022   ALBUMIN 3.9 11/03/2019   CALCIUM 9.3 09/23/2022   ANIONGAP 8 11/03/2019   EGFR 50 (L) 09/23/2022   Lab Results  Component Value Date   CHOL 253 (H) 08/07/2022   Lab Results  Component Value Date   HDL 31 (L) 08/07/2022   Lab Results  Component Value Date   LDLCALC 167 (H) 08/07/2022   Lab Results  Component Value Date   TRIG 327 (H) 08/07/2022   Lab Results  Component Value Date   CHOLHDL 8.2 (H) 08/07/2022   Lab Results  Component Value Date   HGBA1C 5.8 (A) 09/16/2022      Assessment & Plan:   Problem List Items Addressed This Visit       Cardiovascular and Mediastinum   HTN (hypertension)  - Primary    BP Readings from Last 3 Encounters:  10/14/22 139/87  09/23/22 (!) 141/94  09/16/22 (!) 144/92  Currently on amlodipine 10 mg daily, lisinopril 5 mg daily Continue amlodipine 10 mg daily start lisinopril 10  mg daily DASH diet advised patient encouraged to engage in regular moderate to vigorous exercise at least 150 minutes weekly, Follow-up in 3 months       No orders of the defined types were placed in this encounter.   Follow-up: Return in about 3 months (around 01/14/2023) for HTN/HLD.    Renee Rival, FNP

## 2022-10-14 NOTE — Assessment & Plan Note (Signed)
BP Readings from Last 3 Encounters:  10/14/22 139/87  09/23/22 (!) 141/94  09/16/22 (!) 144/92  Currently on amlodipine 10 mg daily, lisinopril 5 mg daily Continue amlodipine 10 mg daily start lisinopril 10 mg daily DASH diet advised patient encouraged to engage in regular moderate to vigorous exercise at least 150 minutes weekly, Follow-up in 3 months

## 2022-10-14 NOTE — Patient Instructions (Addendum)
Please Take lisinopril '10mg'$  daily, amlodipine '10mg'$  daily for hypertension. Blood pressure goal is less than 140/90   Around 3 times per week, check your blood pressure 2 times per day. once in the morning and once in the evening. The readings should be at least one minute apart. Write down these values and bring them to your next nurse visit/appointment.  When you check your BP, make sure you have been doing something calm/relaxing 5 minutes prior to checking. Both feet should be flat on the floor and you should be sitting. Use your left arm and make sure it is in a relaxed position (on a table), and that the cuff is at the approximate level/height of your heart.

## 2022-11-06 ENCOUNTER — Ambulatory Visit: Payer: Self-pay | Admitting: Infectious Disease

## 2022-11-07 ENCOUNTER — Ambulatory Visit: Payer: Self-pay | Admitting: Nurse Practitioner

## 2022-11-25 ENCOUNTER — Telehealth: Payer: Self-pay | Admitting: *Deleted

## 2022-11-25 ENCOUNTER — Other Ambulatory Visit: Payer: Self-pay | Admitting: *Deleted

## 2022-11-25 NOTE — Telephone Encounter (Signed)
Pt came in the office w/o appt--stated--having a hard time dieting and needed to know any option. Sent referral to Weight loss management--[place]  ok per [facility].

## 2022-12-23 ENCOUNTER — Encounter (INDEPENDENT_AMBULATORY_CARE_PROVIDER_SITE_OTHER): Payer: Self-pay | Admitting: Family Medicine

## 2022-12-23 ENCOUNTER — Ambulatory Visit (INDEPENDENT_AMBULATORY_CARE_PROVIDER_SITE_OTHER): Payer: Self-pay | Admitting: Family Medicine

## 2022-12-23 VITALS — BP 151/88 | HR 77 | Temp 97.8°F | Ht 71.0 in | Wt 302.2 lb

## 2022-12-23 DIAGNOSIS — I10 Essential (primary) hypertension: Secondary | ICD-10-CM

## 2022-12-23 DIAGNOSIS — G4733 Obstructive sleep apnea (adult) (pediatric): Secondary | ICD-10-CM

## 2022-12-23 DIAGNOSIS — E66813 Obesity, class 3: Secondary | ICD-10-CM

## 2022-12-23 DIAGNOSIS — Z6841 Body Mass Index (BMI) 40.0 and over, adult: Secondary | ICD-10-CM

## 2022-12-23 DIAGNOSIS — E669 Obesity, unspecified: Secondary | ICD-10-CM

## 2022-12-25 ENCOUNTER — Encounter: Payer: Self-pay | Admitting: Infectious Disease

## 2022-12-25 ENCOUNTER — Other Ambulatory Visit: Payer: Self-pay

## 2022-12-25 ENCOUNTER — Ambulatory Visit (INDEPENDENT_AMBULATORY_CARE_PROVIDER_SITE_OTHER): Payer: Self-pay

## 2022-12-25 ENCOUNTER — Ambulatory Visit (INDEPENDENT_AMBULATORY_CARE_PROVIDER_SITE_OTHER): Payer: Self-pay | Admitting: Infectious Disease

## 2022-12-25 VITALS — BP 155/86 | HR 73 | Temp 98.1°F | Wt 309.0 lb

## 2022-12-25 DIAGNOSIS — I10 Essential (primary) hypertension: Secondary | ICD-10-CM

## 2022-12-25 DIAGNOSIS — Z23 Encounter for immunization: Secondary | ICD-10-CM

## 2022-12-25 DIAGNOSIS — Z7185 Encounter for immunization safety counseling: Secondary | ICD-10-CM

## 2022-12-25 DIAGNOSIS — B20 Human immunodeficiency virus [HIV] disease: Secondary | ICD-10-CM

## 2022-12-25 DIAGNOSIS — E663 Overweight: Secondary | ICD-10-CM

## 2022-12-25 DIAGNOSIS — N183 Chronic kidney disease, stage 3 unspecified: Secondary | ICD-10-CM

## 2022-12-25 MED ORDER — LISINOPRIL 10 MG PO TABS: 10.0000 mg | ORAL_TABLET | Freq: Every day | ORAL | 11 refills | Status: DC

## 2022-12-25 MED ORDER — BIKTARVY 50-200-25 MG PO TABS: 1.0000 | ORAL_TABLET | Freq: Every day | ORAL | 11 refills | Status: DC

## 2022-12-25 MED ORDER — AMLODIPINE BESYLATE 10 MG PO TABS: 10.0000 mg | ORAL_TABLET | Freq: Every day | ORAL | 11 refills | Status: DC

## 2022-12-25 NOTE — Progress Notes (Signed)
Subjective:  Chief complaint: Follow-up for HIV disease and hypertension on medications.   Patient ID: Luis Vincent, male    DOB: June 15, 1986, 37 y.o.   MRN: [id number]  HPI  37 year old black man living with HIV that has been well controlled recently on Biktarvy to which she has been highly adherent.    Dr. Candiss Norse and then was referred to primary care which I had not realized when I began the visit with him.  He had 5 mg of lisinopril added to his amlodipine 10 mg by Dr. Lillie Columbia and I today increased this to '10mg'$   His BP was  much better controlled at last visit.  He is otherwise doing well without complaints has been referred to weight management clinic.        Past Medical History:  Diagnosis Date   CKD (chronic kidney disease) 08/07/2022   Headache    Hemorrhoid 02/27/2022   HIV infection ([place])    HTN (hypertension) 08/07/2022   Immune deficiency disorder ([place])    Obesity 08/01/2021   Routine screening for STI (sexually transmitted infection) 08/07/2022   Sleepiness 08/01/2021   Syphilis    Vaccine counseling 08/07/2022    No past surgical history on file.  Family History  Problem Relation Age of Onset   Lung cancer Father    Emphysema Father    Hypertension Sister    Hypertension Maternal Grandmother       Social History   Socioeconomic History   Marital status: Single    Spouse name: Not on file   Number of children: Not on file   Years of education: Not on file   Highest education level: Not on file  Occupational History   Not on file  Tobacco Use   Smoking status: Never   Smokeless tobacco: Never  Substance and Sexual Activity   Alcohol use: No   Drug use: No    Comment: declined condoms   Sexual activity: Not Currently    Comment: declined condoms  Other Topics Concern   Not on file  Social History Narrative   Works as a Administrator.    Social Determinants of Health   Financial Resource Strain: Not on file  Food Insecurity: Not on  file  Transportation Needs: Not on file  Physical Activity: Not on file  Stress: Not on file  Social Connections: Not on file    No Known Allergies   Current Outpatient Medications:    amLODipine (NORVASC) 10 MG tablet, Take 1 tablet (10 mg total) by mouth daily., Disp: 30 tablet, Rfl: 11   bictegravir-emtricitabine-tenofovir AF (BIKTARVY) 50-200-25 MG TABS tablet, Take 1 tablet by mouth daily., Disp: 30 tablet, Rfl: 11   lisinopril (PRINIVIL) 10 MG tablet, Take 1 tablet (10 mg total) by mouth daily., Disp: 30 tablet, Rfl: 11    Review of Systems  Constitutional:  Negative for activity change, appetite change, chills, diaphoresis, fatigue, fever and unexpected weight change.  HENT:  Negative for congestion, rhinorrhea, sinus pressure, sneezing, sore throat and trouble swallowing.   Eyes:  Negative for photophobia and visual disturbance.  Respiratory:  Negative for cough, chest tightness, shortness of breath, wheezing and stridor.   Cardiovascular:  Negative for chest pain, palpitations and leg swelling.  Gastrointestinal:  Negative for abdominal distention, abdominal pain, anal bleeding, blood in stool, constipation, diarrhea, nausea and vomiting.  Genitourinary:  Negative for difficulty urinating, dysuria, flank pain and hematuria.  Musculoskeletal:  Negative for arthralgias, back pain, gait problem, joint swelling and  myalgias.  Skin:  Negative for color change, pallor, rash and wound.  Neurological:  Negative for dizziness, tremors, weakness and light-headedness.  Hematological:  Negative for adenopathy. Does not bruise/bleed easily.  Psychiatric/Behavioral:  Negative for agitation, behavioral problems, confusion, decreased concentration, dysphoric mood and sleep disturbance.        Objective:   Physical Exam Constitutional:      Appearance: He is well-developed.  HENT:     Head: Normocephalic and atraumatic.  Eyes:     Conjunctiva/sclera: Conjunctivae normal.   Cardiovascular:     Rate and Rhythm: Normal rate and regular rhythm.  Pulmonary:     Effort: Pulmonary effort is normal. No respiratory distress.     Breath sounds: No wheezing.  Abdominal:     General: There is no distension.     Palpations: Abdomen is soft.  Musculoskeletal:        General: No tenderness. Normal range of motion.     Cervical back: Normal range of motion and neck supple.  Skin:    General: Skin is warm and dry.     Coloration: Skin is not pale.     Findings: No erythema or rash.  Neurological:     General: No focal deficit present.     Mental Status: He is alert and oriented to person, place, and time.  Psychiatric:        Mood and Affect: Mood normal.        Behavior: Behavior normal.        Thought Content: Thought content normal.        Judgment: Judgment normal.          Assessment & Plan:  HIV disease:  I will add order HIV viral load CD4 count CBC with differential CMP, RPR GC and chlamydia and I will continue  Keyonta Klinker's Biktarvy, prescription    Hypertension: He will continue on his Norvasc and lisinopril and follow-up with PCP   Cardiovascular risk in reprieve: Note dated was a 74 and above with HIV can discussed with him  CKD: Will recheck CMP today.  We talked a lot about how blood pressure can affect kidney function and that is probably the biggest risk to his kidneys that he has.  I am not worried about the denial for alafenamide and BIKTARVY.  Overweight: he is going to weight management clinic   Vaccine counseling  recommended updated COVID 19 vaccine which he received today.

## 2022-12-26 LAB — T-HELPER CELLS (CD4) COUNT (NOT AT ARMC)
CD4 % Helper T Cell: 23 % — ABNORMAL LOW (ref 33–65)
CD4 T Cell Abs: 608 /uL (ref 400–1790)

## 2022-12-27 ENCOUNTER — Telehealth: Payer: Self-pay

## 2022-12-27 LAB — URINE CYTOLOGY ANCILLARY ONLY
Chlamydia: NEGATIVE
Comment: NEGATIVE
Comment: NORMAL
Neisseria Gonorrhea: NEGATIVE

## 2022-12-27 NOTE — Telephone Encounter (Signed)
Patient called wanting to go over results.   Relayed that urine cytology and viral load are still pending.   CD4 count healthy at over 600, kidney function about the same as last result 3 months ago. Blood counts within normal limits. RPR and HCV negative. Discussed that cholesterol remains elevated.   Patient verbalized understanding and has no further questions.   Beryle Flock, RN

## 2022-12-28 LAB — LIPID PANEL
Cholesterol: 255 mg/dL — ABNORMAL HIGH (ref <200)
HDL: 32 mg/dL — ABNORMAL LOW (ref >=40)
LDL Cholesterol (Calc): 185 mg/dL (calc) — ABNORMAL HIGH
Non-HDL Cholesterol (Calc): 223 mg/dL (calc) — ABNORMAL HIGH (ref <130)
Total CHOL/HDL Ratio: 8 (calc) — ABNORMAL HIGH (ref <5.0)
Triglycerides: 197 mg/dL — ABNORMAL HIGH (ref <150)

## 2022-12-28 LAB — CBC WITH DIFFERENTIAL/PLATELET
Absolute Monocytes: 525 cells/uL (ref 200–950)
Basophils Absolute: 67 cells/uL (ref 0–200)
Basophils Relative: 0.9 %
Eosinophils Absolute: 237 cells/uL (ref 15–500)
Eosinophils Relative: 3.2 %
HCT: 42.8 % (ref 38.5–50.0)
Hemoglobin: 14.5 g/dL (ref 13.2–17.1)
Lymphs Abs: 2834 cells/uL (ref 850–3900)
MCH: 29.7 pg (ref 27.0–33.0)
MCHC: 33.9 g/dL (ref 32.0–36.0)
MCV: 87.7 fL (ref 80.0–100.0)
MPV: 10.1 fL (ref 7.5–12.5)
Monocytes Relative: 7.1 %
Neutro Abs: 3737 cells/uL (ref 1500–7800)
Neutrophils Relative %: 50.5 %
Platelets: 344 10*3/uL (ref 140–400)
RBC: 4.88 10*6/uL (ref 4.20–5.80)
RDW: 12.3 % (ref 11.0–15.0)
Total Lymphocyte: 38.3 %
WBC: 7.4 10*3/uL (ref 3.8–10.8)

## 2022-12-28 LAB — COMPLETE METABOLIC PANEL WITH GFR
AG Ratio: 1.2 (calc) (ref 1.0–2.5)
ALT: 25 U/L (ref 9–46)
AST: 21 U/L (ref 10–40)
Albumin: 4.2 g/dL (ref 3.6–5.1)
Alkaline phosphatase (APISO): 65 U/L (ref 36–130)
BUN/Creatinine Ratio: 7 (calc) (ref 6–22)
BUN: 12 mg/dL (ref 7–25)
CO2: 26 mmol/L (ref 20–32)
Calcium: 9.5 mg/dL (ref 8.6–10.3)
Chloride: 101 mmol/L (ref 98–110)
Creat: 1.67 mg/dL — ABNORMAL HIGH (ref 0.60–1.26)
Globulin: 3.4 g/dL (calc) (ref 1.9–3.7)
Glucose, Bld: 106 mg/dL — ABNORMAL HIGH (ref 65–99)
Potassium: 3.7 mmol/L (ref 3.5–5.3)
Sodium: 138 mmol/L (ref 135–146)
Total Bilirubin: 0.7 mg/dL (ref 0.2–1.2)
Total Protein: 7.6 g/dL (ref 6.1–8.1)
eGFR: 54 mL/min/{1.73_m2} — ABNORMAL LOW (ref >=60)

## 2022-12-28 LAB — HIV-1 RNA QUANT-NO REFLEX-BLD
HIV 1 RNA Quant: NOT DETECTED Copies/mL
HIV-1 RNA Quant, Log: NOT DETECTED Log cps/mL

## 2022-12-28 LAB — HEPATITIS C AB W/RFL RNA, PCR + GENO: Hepatitis C Ab: NONREACTIVE

## 2022-12-28 LAB — RPR: RPR Ser Ql: NONREACTIVE

## 2023-01-14 ENCOUNTER — Ambulatory Visit: Payer: Self-pay | Admitting: Nurse Practitioner

## 2023-01-14 NOTE — Progress Notes (Signed)
Office: [phone]  /  Fax: [phone]   Initial Visit  Luis Vincent was seen in clinic today to evaluate for obesity. He is interested in losing weight to improve overall health and reduce the risk of weight related complications. He presents today to review program treatment options, initial physical assessment, and evaluation.  He was referred by: PCP  When asked what else they would like to accomplish? He states: Improve existing medical conditions and Improve quality of life  When asked how has your weight affected you? 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  Current level of physical activity: None  Current or previous pharmacotherapy: None  Response to medication: Never tried medications [Date Past medical history includes:   Past Medical History:  Diagnosis Date   CKD (chronic kidney disease) 08/07/2022   Headache    Hemorrhoid 02/27/2022   HIV infection ([facility])    HTN (hypertension) 08/07/2022   Immune deficiency disorder ([place])    Obesity 08/01/2021   Routine screening for STI (sexually transmitted infection) 08/07/2022   Sleepiness 08/01/2021   Syphilis    Vaccine counseling 08/07/2022   Objective:   BP (!) 151/88   Pulse 77   Temp 97.8 F (36.6 C)   Ht 5' 11"$  (1.803 m)   Wt (!) 302 lb 3.2 oz (137.1 kg)   SpO2 97%   BMI 42.15 kg/m  He was weighed on the bioimpedance scale: Body mass index is 42.15 kg/m.  Peak Weight:302 ,Visceral Fat Rating:19, Body Fat%:34.3  General:  Alert, oriented and cooperative. Patient is in no acute distress.  Respiratory: Normal  respiratory effort, no problems with respiration noted  Extremities: Normal range of motion.    Mental Status: Normal mood and affect. Normal behavior. Normal judgment and thought content.   Assessment and Plan:  No orders of the defined types were placed in this encounter.   There are no discontinued medications.   No orders of the defined types were placed in this encounter.    1. Essential hypertension Patient started on medications to 3 months ago.  Also has been diagnosed with CKD at that time.  Patient desires to get healthier and eventually get off medications.  Patient is taking Norvasc, and Zestril.  Check CMP.  2. OSA on CPAP Patient had a sleep study 2 to 3 months ago.  Positive for OSA, using nightly CPAP with good results.  Continue CPAP machine nightly.  Counseled on OSA and impact on hypertension, weight loss, etc. discussed.  3. Obesity, current BMI 42.1 1.  Check CMP due to chronic kidney disease at next office visit. 2.  Bring in blood pressure log and follow-up with PCP for medication adjustment as planned.  We reviewed weight, biometrics, associated medical conditions and contributing factors with patient. He would benefit from weight loss therapy via a modified calorie, low-carb, high-protein nutritional plan tailored to their REE (resting energy expenditure) which will be determined by indirect calorimetry.  We will also assess for cardiometabolic risk and nutritional derangements via fasting serologies at his next appointment.    Obesity Treatment / Action Plan:  Patient will work on garnering support from family and  friends to begin weight loss journey. Will work on reducing intake of added sugars, simple sugars and processed carbs. Will reduce liquid calories and sugary drinks from diet. Counseled on the health benefits of losing 5%-15% of total body weight. Was counseled on pharmacotherapy and role as an adjunct in weight management.   Obesity Education  Performed Today:  He was weighed on the bioimpedance scale and results were discussed and documented in the synopsis.  We discussed obesity as a disease and the importance of a more detailed evaluation of all the factors contributing to the disease.  We discussed the importance of long term lifestyle changes which include nutrition, exercise and behavioral modifications as well as the importance of customizing this to his specific health and social needs.  We discussed the benefits of reaching a healthier weight to alleviate the symptoms of existing conditions and reduce the risks of the biomechanical, metabolic and psychological effects of obesity.  Luis Vincent appears to be in the action stage of change and states they are ready to start intensive lifestyle modifications and behavioral modifications.  40 minutes was spent today on this visit including the above counseling, pre-visit chart review, and post-visit documentation.  Reviewed by clinician on day of visit: allergies, medications, problem list, medical history, surgical history, family history, social history, and previous encounter notes.  I, Davy Pique, RMA, am acting as Location manager for [employer], DO.    I have reviewed the above documentation for accuracy and completeness, and I agree with the above. Marjory Sneddon, D.O.  The [place] was signed into law in 2016 which includes the topic of electronic health records.  This provides immediate access to information in MyChart.  This includes consultation notes, operative notes, office notes, lab results and pathology reports.  If you have any questions about what you read please let us know at your next visit so we can discuss your concerns and take corrective action if need be.  We are right here with you.

## 2023-01-15 ENCOUNTER — Encounter: Payer: Self-pay | Admitting: Nurse Practitioner

## 2023-01-15 ENCOUNTER — Other Ambulatory Visit: Payer: Self-pay | Admitting: Nurse Practitioner

## 2023-01-15 ENCOUNTER — Ambulatory Visit (INDEPENDENT_AMBULATORY_CARE_PROVIDER_SITE_OTHER): Payer: Self-pay | Admitting: Nurse Practitioner

## 2023-01-15 VITALS — BP 134/79 | HR 90 | Temp 97.7°F | Ht 71.0 in | Wt 303.4 lb

## 2023-01-15 DIAGNOSIS — I10 Essential (primary) hypertension: Secondary | ICD-10-CM

## 2023-01-15 DIAGNOSIS — Z1321 Encounter for screening for nutritional disorder: Secondary | ICD-10-CM

## 2023-01-15 DIAGNOSIS — E782 Mixed hyperlipidemia: Secondary | ICD-10-CM

## 2023-01-15 DIAGNOSIS — Z1329 Encounter for screening for other suspected endocrine disorder: Secondary | ICD-10-CM

## 2023-01-15 DIAGNOSIS — Z23 Encounter for immunization: Secondary | ICD-10-CM

## 2023-01-15 DIAGNOSIS — Z0000 Encounter for general adult medical examination without abnormal findings: Secondary | ICD-10-CM

## 2023-01-15 MED ORDER — ATORVASTATIN CALCIUM 10 MG PO TABS: 10.0000 mg | ORAL_TABLET | Freq: Every day | ORAL | 0 refills | Status: DC

## 2023-01-15 NOTE — Assessment & Plan Note (Signed)
Annual exam as documented.  Counseling done include healthy lifestyle involving committing to 150 minutes of exercise per week, heart healthy diet, and attaining healthy weight. The importance of adequate sleep also discussed.  Regular use of seat belt and home safety were also discussed . Changes in health habits are decided on by patient with goals and time frames set for achieving them. Immunization  screening  needs are specifically addressed at this visit.   Tdap vaccine given in the office today Check TSH, vitamin D levels

## 2023-01-15 NOTE — Assessment & Plan Note (Signed)
Patient educated on CDC recommendation for the TDAP vaccine. Verbal consent was obtained from the patient, vaccine administered by nurse, no sign of adverse reactions noted at this time. Patient education on arm soreness and use of tylenol  for this patient  was discussed. Patient educated on the signs and symptoms of adverse effect and advise to contact the office if they occur. 

## 2023-01-15 NOTE — Assessment & Plan Note (Addendum)
BP Readings from Last 3 Encounters:  01/15/23 134/79  12/25/22 (!) 155/86  12/23/22 (!) 151/88  Chronic medical condition currently well-controlled on amlodipine 10 mg daily, lisinopril 10 mg daily Continue current medications DASH diet advised patient encouraged to engage in regular moderate to vigorous exercises at least 150 minutes weekly Follow-up in 4 months

## 2023-01-15 NOTE — Assessment & Plan Note (Addendum)
Lab Results  Component Value Date   CHOL 255 (H) 12/25/2022   HDL 32 (L) 12/25/2022   LDLCALC 185 (H) 12/25/2022   TRIG 197 (H) 12/25/2022   CHOLHDL 8.0 (H) 12/25/2022  Labs done at the infectious disease clinic he reported that this was not a fasting labs but labs has been persistently elevated, due to his CKD, HTN will start patient on atorvastatin 10 mg daily Patient encouraged to lose weight. Follow up in 2 months.

## 2023-01-15 NOTE — Patient Instructions (Signed)
It is important that you exercise regularly at least 30 minutes 5 times a week as tolerated  Think about what you will eat, plan ahead. Choose " clean, green, fresh or frozen" over canned, processed or packaged foods which are more sugary, salty and fatty. 70 to 75% of food eaten should be vegetables and fruit. Three meals at set times with snacks allowed between meals, but they must be fruit or vegetables. Aim to eat over a 12 hour period , example 7 am to 7 pm, and STOP after  your last meal of the day. Drink water,generally about 64 ounces per day, no other drink is as healthy. Fruit juice is best enjoyed in a healthy way, by EATING the fruit.  Thanks for choosing [name] we consider it a privelige to serve you.

## 2023-01-15 NOTE — Progress Notes (Addendum)
Complete physical exam  Patient: Luis Vincent   DOB: February 02, 1986   37 y.o. Male  MRN: [id number]  Subjective:    Chief Complaint  Patient presents with   Follow-up    Luis Vincent is a 37 y.o. male with past medical history of hypertension, hyperlipidemia, CKD, HIV disease, obesity, obstructive sleep apnea  who presents today for a complete physical exam.He generally feels well. He reports sleeping well. He does not have additional problems to discuss today.  He has started going to the weight management clinic for his obesity.  Hypertension.  Currently on amlodipine 10 mg daily, lisinopril 10 mg daily.  Patient denies dizziness, syncope, edema.  Reports that he has been taking the medications daily as ordered   Due for Tdap vaccine Tdap vaccine administered in the office today    Most recent fall risk assessment:    12/25/2022    2:53 PM  [place] in the past year? 0  Risk for fall due to : No Fall Risks  Follow up Falls evaluation completed     Most recent depression screenings:    01/15/2023    3:09 PM 12/25/2022    2:53 PM  PHQ 2/9 Scores  PHQ - 2 Score 0 0        Patient Care Team: Renee Rival, FNP as PCP - General (Nurse Practitioner) Carlyle Basques, MD as Consulting Physician (Infectious Diseases)   Outpatient Medications Prior to Visit  Medication Sig   amLODipine (NORVASC) 10 MG tablet Take 1 tablet (10 mg total) by mouth daily.   bictegravir-emtricitabine-tenofovir AF (BIKTARVY) 50-200-25 MG TABS tablet Take 1 tablet by mouth daily.   lisinopril (PRINIVIL) 10 MG tablet Take 1 tablet (10 mg total) by mouth daily.   No facility-administered medications prior to visit.    Review of Systems  Constitutional: Negative.  Negative for chills, diaphoresis, fever, malaise/fatigue and weight loss.  HENT: Negative.  Negative for ear discharge, ear pain, hearing loss and tinnitus.   Eyes: Negative.  Negative for pain, discharge and  redness.  Respiratory: Negative.  Negative for cough, hemoptysis, sputum production, shortness of breath and wheezing.   Cardiovascular: Negative.  Negative for chest pain, palpitations, orthopnea, claudication and leg swelling.  Gastrointestinal: Negative.  Negative for abdominal pain, diarrhea, heartburn, nausea and vomiting.  Genitourinary: Negative.  Negative for dysuria, frequency, hematuria and urgency.  Musculoskeletal: Negative.  Negative for back pain, joint pain, myalgias and neck pain.  Skin: Negative.  Negative for itching and rash.  Neurological: Negative.  Negative for dizziness, tingling, tremors, sensory change, speech change, focal weakness, weakness and headaches.  Endo/Heme/Allergies: Negative.   Psychiatric/Behavioral: Negative.  Negative for depression, hallucinations, memory loss, substance abuse and suicidal ideas. The patient is not nervous/anxious and does not have insomnia.           Objective:     BP 134/79   Pulse 90   Temp 97.7 F (36.5 C)   Ht '5\' 11"'$  (1.803 m)   Wt (!) 303 lb 6.4 oz (137.6 kg)   SpO2 100%   BMI 42.32 kg/m    Physical Exam Constitutional:      General: He is not in acute distress.    Appearance: He is obese. He is not ill-appearing, toxic-appearing or diaphoretic.  HENT:     Head: Normocephalic.     Right Ear: Tympanic membrane, ear canal and external ear normal. There is no impacted cerumen.     Left Ear: Tympanic  membrane, ear canal and external ear normal. There is no impacted cerumen.     Mouth/Throat:     Mouth: Mucous membranes are moist.     Pharynx: No oropharyngeal exudate or posterior oropharyngeal erythema.  Eyes:     General: No scleral icterus.       Right eye: No discharge.        Left eye: No discharge.     Extraocular Movements: Extraocular movements intact.  Neck:     Vascular: No carotid bruit.  Cardiovascular:     Rate and Rhythm: Normal rate and regular rhythm.     Pulses: Normal pulses.     Heart  sounds: Normal heart sounds. No murmur heard.    No friction rub. No gallop.  Pulmonary:     Effort: Pulmonary effort is normal. No respiratory distress.     Breath sounds: Normal breath sounds. No stridor. No wheezing, rhonchi or rales.  Chest:     Chest wall: No tenderness.  Abdominal:     General: There is no distension.     Palpations: Abdomen is soft. There is no mass.     Tenderness: There is no abdominal tenderness. There is no right CVA tenderness, left CVA tenderness, guarding or rebound.     Hernia: No hernia is present.     Comments: Lipoma right upper abdomen , non tender on palpation.   Musculoskeletal:        General: No swelling, tenderness, deformity or signs of injury. Normal range of motion.     Cervical back: Normal range of motion and neck supple. No rigidity or tenderness.     Right lower leg: No edema.     Left lower leg: No edema.  Lymphadenopathy:     Cervical: No cervical adenopathy.  Skin:    General: Skin is warm and dry.     Capillary Refill: Capillary refill takes less than 2 seconds.     Coloration: Skin is not jaundiced or pale.     Findings: No bruising, erythema, lesion or rash.  Neurological:     Mental Status: He is alert and oriented to person, place, and time.     Cranial Nerves: No cranial nerve deficit.     Sensory: No sensory deficit.     Motor: No weakness.     Coordination: Coordination normal.     Gait: Gait normal.     Deep Tendon Reflexes: Reflexes normal.  Psychiatric:        Mood and Affect: Mood normal.        Behavior: Behavior normal.        Thought Content: Thought content normal.        Judgment: Judgment normal.      No results found for any visits on 01/15/23.     Assessment & Plan:    Routine Health Maintenance and Physical Exam  Immunization History  Administered Date(s) Administered   COVID-19, mRNA, vaccine(Comirnaty)12 years and older 12/25/2022   Hepatitis A, Adult 03/31/2014, 12/13/2014    Influenza,inj,Quad PF,6+ Mos 02/17/2014, 09/08/2014, 12/11/2016, 09/26/2021   Meningococcal Mcv4o 12/11/2016, 02/03/2019   PPD Test 09/15/2014   Pneumococcal Conjugate-13 02/03/2019   Pneumococcal Polysaccharide-23 02/17/2014   Tdap 01/15/2023    Health Maintenance  Topic Date Due   COVID-19 Vaccine (2 - Pfizer risk series) 01/15/2023   INFLUENZA VACCINE  03/09/2023 (Originally 07/09/2022)   DTaP/Tdap/Td (2 - Td or Tdap) 01/15/2033   Hepatitis C Screening  Completed   HIV Screening  Completed  HPV VACCINES  Aged Out    Discussed health benefits of physical activity, and encouraged him to engage in regular exercise appropriate for his age and condition.  Problem List Items Addressed This Visit       Cardiovascular and Mediastinum   HTN (hypertension)    BP Readings from Last 3 Encounters:  01/15/23 134/79  12/25/22 (!) 155/86  12/23/22 (!) 151/88  Chronic medical condition currently well-controlled on amlodipine 10 mg daily, lisinopril 10 mg daily Continue current medications DASH diet advised patient encouraged to engage in regular moderate to vigorous exercises at least 150 minutes weekly Follow-up in 4 months        Other   Annual physical exam - Primary    Annual exam as documented.  Counseling done include healthy lifestyle involving committing to 150 minutes of exercise per week, heart healthy diet, and attaining healthy weight. The importance of adequate sleep also discussed.  Regular use of seat belt and home safety were also discussed . Changes in health habits are decided on by patient with goals and time frames set for achieving them. Immunization  screening  needs are specifically addressed at this visit.   Tdap vaccine given in the office today Check TSH, vitamin D levels      Hyperlipidemia    Lab Results  Component Value Date   CHOL 255 (H) 12/25/2022   HDL 32 (L) 12/25/2022   LDLCALC 185 (H) 12/25/2022   TRIG 197 (H) 12/25/2022   CHOLHDL 8.0 (H)  12/25/2022  Labs done at the infectious disease clinic he reported that this was not a fasting labs but labs has been persistently elevated, due to his CKD, HTN will start patient on atorvastatin 10 mg daily Patient encouraged to lose weight. Follow up in 2 months.        Need for Tdap vaccination    Patient educated on CDC recommendation for the TDAP vaccine. Verbal consent was obtained from the patient, vaccine administered by nurse, no sign of adverse reactions noted at this time. Patient education on arm soreness and use of tylenol for this patient  was discussed. Patient educated on the signs and symptoms of adverse effect and advise to contact the office if they occur.       Relevant Orders   Tdap vaccine greater than or equal to 7yo IM (Completed)   Other Visit Diagnoses     Screening for thyroid disorder       Relevant Orders   TSH   Encounter for vitamin deficiency screening       Relevant Orders   Vitamin D, 25-hydroxy      Return in about 4 months (around 05/16/2023) for HTN/HLD.     Renee Rival, FNP

## 2023-01-16 LAB — TSH: TSH: 2.19 u[IU]/mL (ref 0.450–4.500)

## 2023-01-16 LAB — VITAMIN D 25 HYDROXY (VIT D DEFICIENCY, FRACTURES): Vit D, 25-Hydroxy: 6.8 ng/mL — ABNORMAL LOW (ref 30.0–100.0)

## 2023-01-17 ENCOUNTER — Other Ambulatory Visit: Payer: Self-pay | Admitting: Nurse Practitioner

## 2023-01-17 DIAGNOSIS — E559 Vitamin D deficiency, unspecified: Secondary | ICD-10-CM

## 2023-01-17 MED ORDER — VITAMIN D (ERGOCALCIFEROL) 1.25 MG (50000 UNIT) PO CAPS: 50000.0000 [IU] | ORAL_CAPSULE | ORAL | 0 refills | Status: AC

## 2023-01-17 NOTE — Progress Notes (Signed)
Vitamin D deficiency. Start taking vitamin D 50,000 units once weekly for 8 weeks, after 8 weeks take vitamin D 1000 units daily, get early morning sunshine. Please schedule an appointment in 2 months for hyperlipidemia and vitamin D deficiency. He should come fasting to that appointment .  Foods rich in vitamin D include Cod liver oil,Salmon,Swordfish,Tuna fish,Dairy and plant milks fortified with vitamin D,Sardines,Beef liver   Thyroid function is normal

## 2023-01-28 NOTE — Progress Notes (Signed)
Lab Result mail it to pt home address.

## 2023-05-14 ENCOUNTER — Ambulatory Visit (INDEPENDENT_AMBULATORY_CARE_PROVIDER_SITE_OTHER): Payer: Self-pay | Admitting: Nurse Practitioner

## 2023-05-14 ENCOUNTER — Encounter: Payer: Self-pay | Admitting: Nurse Practitioner

## 2023-05-14 VITALS — BP 138/81 | HR 62 | Ht 71.0 in | Wt 277.6 lb

## 2023-05-14 DIAGNOSIS — E559 Vitamin D deficiency, unspecified: Secondary | ICD-10-CM | POA: Insufficient documentation

## 2023-05-14 DIAGNOSIS — D1779 Benign lipomatous neoplasm of other sites: Secondary | ICD-10-CM

## 2023-05-14 DIAGNOSIS — Z6841 Body Mass Index (BMI) 40.0 and over, adult: Secondary | ICD-10-CM

## 2023-05-14 DIAGNOSIS — N1832 Chronic kidney disease, stage 3b: Secondary | ICD-10-CM | POA: Insufficient documentation

## 2023-05-14 DIAGNOSIS — I129 Hypertensive chronic kidney disease with stage 1 through stage 4 chronic kidney disease, or unspecified chronic kidney disease: Secondary | ICD-10-CM

## 2023-05-14 DIAGNOSIS — N183 Chronic kidney disease, stage 3 unspecified: Secondary | ICD-10-CM

## 2023-05-14 DIAGNOSIS — D179 Benign lipomatous neoplasm, unspecified: Secondary | ICD-10-CM | POA: Insufficient documentation

## 2023-05-14 DIAGNOSIS — E782 Mixed hyperlipidemia: Secondary | ICD-10-CM

## 2023-05-14 NOTE — Patient Instructions (Addendum)
Please start taking lisinopril 10 mg daily.  Blood pressure goal is less than 130/80, avoid taking ibuprofen, Aleve.  Drink at least 64 ounces of water daily to maintain hydration   Class 3 severe obesity due to excess calories with serious comorbidity and body mass index (BMI) of 40.0 to 44.9 in adult [facility])   Lipoma of other specified sites  - Ambulatory referral to General Surgery  Mixed hyperlipidemia  - Lipid panel; Future   Vitamin D deficiency  - VITAMIN D 25 Hydroxy (Vit-D Deficiency, Fractures); Future   Primary hypertension  Stage 3b chronic kidney disease (HCC)  - Basic Metabolic Panel    It is important that you exercise regularly at least 30 minutes 5 times a week as tolerated  Think about what you will eat, plan ahead. Choose " clean, green, fresh or frozen" over canned, processed or packaged foods which are more sugary, salty and fatty. 70 to 75% of food eaten should be vegetables and fruit. Three meals at set times with snacks allowed between meals, but they must be fruit or vegetables. Aim to eat over a 12 hour period , example 7 am to 7 pm, and STOP after  your last meal of the day. Drink water,generally about 64 ounces per day, no other drink is as healthy. Fruit juice is best enjoyed in a healthy way, by EATING the fruit.  Thanks for choosing [facility] we consider it a privelige to serve you.

## 2023-05-14 NOTE — Assessment & Plan Note (Addendum)
Wt Readings from Last 3 Encounters:  05/14/23 277 lb 9.6 oz (125.9 kg)  01/15/23 (!) 303 lb 6.4 oz (137.6 kg)  12/25/22 (!) 309 lb (140.2 kg)  Patient has lost about 26 pounds since last visit Currently taking phentermine prescribed by weight management, also following a diet plan and exercising daily Patient congratulated on his efforts at losing weight He was counseled on low-carb modified diet Engage in regular moderate to vigorous exercises at least 150 minutes weekly.

## 2023-05-14 NOTE — Assessment & Plan Note (Signed)
Lab Results  Component Value Date   CHOL 255 (H) 12/25/2022   HDL 32 (L) 12/25/2022   LDLCALC 185 (H) 12/25/2022   TRIG 197 (H) 12/25/2022   CHOLHDL 8.0 (H) 12/25/2022  Currently not taking atorvastatin Rechecking lipid panel since he has been losing weight If LDL remains elevated will encourage patient to start taking atorvastatin has ordered  Plan discussed with the patient

## 2023-05-14 NOTE — Assessment & Plan Note (Addendum)
BP Readings from Last 3 Encounters:  05/14/23 138/81  01/15/23 134/79  12/25/22 (!) 155/86       Latest Ref Rng & Units 12/25/2022    3:14 PM 09/23/2022    3:22 PM 09/05/2022    3:55 PM  BMP  Glucose 65 - 99 mg/dL 409  82  88   BUN 7 - 25 mg/dL 12  17  24    Creatinine 0.60 - 1.26 mg/dL 8.11  9.14  7.82   BUN/Creat Ratio 6 - 22 (calc) 7  10  14    Sodium 135 - 146 mmol/L 138  137  136   Potassium 3.5 - 5.3 mmol/L 3.7  4.3  4.4   Chloride 98 - 110 mmol/L 101  104  102   CO2 20 - 32 mmol/L 26  26  27    Calcium 8.6 - 10.3 mg/dL 9.5  9.3  9.4   Patient encouraged to restart lisinopril 10 mg daily with blood pressure goal of less than 130/80. Hold amlodipine for now.  Avoid NSAIDs and other nephrotoxic agent Drink at least 64 ounces of water daily to maintain hydration Engage in regular moderate to vigorous exercise at least 150 minutes weekly, DASH diet advised Checking BMP

## 2023-05-14 NOTE — Assessment & Plan Note (Signed)
Last vitamin D Lab Results  Component Value Date   VD25OH 6.8 (L) 01/15/2023  Not taking vitamin D supplement Re Checking vitamin D levels today

## 2023-05-14 NOTE — Assessment & Plan Note (Addendum)
Noted on Ct scan done in 2020  1. 7.2 x 7.3 x 7 cm in the subcutaneous fatty tissues along the lateral aspect of the right abdomen has an appearance consistent with a simple lipoma.  Patient is considering surgery referral placed to general surgery

## 2023-05-14 NOTE — Progress Notes (Signed)
Established Patient Office Visit  Subjective:  Patient ID: Luis Vincent, male    DOB: [birth year]  Age: 37 y.o. MRN: [id number]  CC:  Chief Complaint  Patient presents with   Follow-up    HPI Luis Vincent is a 37 y.o. male  has a past medical history of CKD (chronic kidney disease) (08/07/2022), Headache, Hemorrhoid (02/27/2022), HIV infection (HCC), HTN (hypertension) (08/07/2022), Immune deficiency disorder (HCC), Obesity (08/01/2021), Routine screening for STI (sexually transmitted infection) (08/07/2022), Sleepiness (08/01/2021), Syphilis, and Vaccine counseling (08/07/2022).   Patient presents for follow-up for hyperlipidemia and vitamin D deficiency.  Prescription for atorvastatin 10 mg daily and vitamin D 50,000 units once weekly sent to the pharmacy at his last visit but the patient stated that he did not pick up both medications from the pharmacy.  Hypertension/CKD.  Has lisinopril 10 mg daily, amlodipine 10 mg daily ordered.  Patient stated that he stopped taking both medications about a month ago.  Now going to the weight management clinic taking phentermine 30 mg daily. Stated  that has helped him to lose weight.  He thought that since losing weight he does not need to be on blood pressure medications.  He denies chest pain, shortness of breath, edema. Does walking exercises daily and following a diet plan.  He is worried  about the lipoma on the right side of his abdomen which has become more pronounced since he has started losing weight.  The site does not hurt but he is interested in having surgery to remove the lipoma.    Past Medical History:  Diagnosis Date   CKD (chronic kidney disease) 08/07/2022   Headache    Hemorrhoid 02/27/2022   HIV infection (HCC)    HTN (hypertension) 08/07/2022   Immune deficiency disorder (HCC)    Obesity 08/01/2021   Routine screening for STI (sexually transmitted infection) 08/07/2022   Sleepiness 08/01/2021   Syphilis    Vaccine  counseling 08/07/2022    No past surgical history on file.  Family History  Problem Relation Age of Onset   Lung cancer Father    Emphysema Father    Hypertension Sister    Hypertension Maternal Grandmother     Social History   Socioeconomic History   Marital status: Single    Spouse name: Not on file   Number of children: Not on file   Years of education: Not on file   Highest education level: Not on file  Occupational History   Not on file  Tobacco Use   Smoking status: Never   Smokeless tobacco: Never  Substance and Sexual Activity   Alcohol use: No   Drug use: No    Comment: declined condoms   Sexual activity: Not Currently    Comment: declined condoms  Other Topics Concern   Not on file  Social History Narrative   Works as a [employer].    Social Determinants of Health   Financial Resource Strain: Not on file  Food Insecurity: Not on file  Transportation Needs: Not on file  Physical Activity: Not on file  Stress: Not on file  Social Connections: Not on file  Intimate Partner Violence: Not on file    Outpatient Medications Prior to Visit  Medication Sig Dispense Refill   atorvastatin (LIPITOR) 10 MG tablet Take 1 tablet (10 mg total) by mouth daily. (Patient not taking: Reported on 05/14/2023) 90 tablet 0   bictegravir-emtricitabine-tenofovir AF (BIKTARVY) 50-200-25 MG TABS tablet Take 1 tablet by mouth daily. 30 tablet  11   phentermine 30 MG capsule Take 30 mg by mouth every morning.     Vitamin D, Ergocalciferol, (DRISDOL) 1.25 MG (50000 UNIT) CAPS capsule Take 1 capsule (50,000 Units total) by mouth every 7 (seven) days. (Patient not taking: Reported on 05/14/2023) 8 capsule 0   amLODipine (NORVASC) 10 MG tablet Take 1 tablet (10 mg total) by mouth daily. (Patient not taking: Reported on 05/14/2023) 30 tablet 11   lisinopril (PRINIVIL) 10 MG tablet Take 1 tablet (10 mg total) by mouth daily. (Patient not taking: Reported on 05/14/2023) 30 tablet 11   No  facility-administered medications prior to visit.    No Known Allergies  ROS Review of Systems  Constitutional:  Negative for activity change, appetite change, chills, diaphoresis, fatigue, fever and unexpected weight change.  HENT:  Negative for congestion, dental problem, drooling and ear discharge.   Eyes:  Negative for pain, discharge, redness and itching.  Respiratory:  Negative for apnea, cough, choking, chest tightness, shortness of breath and wheezing.   Cardiovascular: Negative.  Negative for chest pain, palpitations and leg swelling.  Gastrointestinal:  Negative for abdominal distention, abdominal pain, anal bleeding, blood in stool, constipation, diarrhea and vomiting.  Endocrine: Negative for polydipsia, polyphagia and polyuria.  Genitourinary:  Negative for difficulty urinating, flank pain, frequency and genital sores.  Musculoskeletal: Negative.  Negative for arthralgias, back pain, gait problem and joint swelling.  Skin:  Negative for color change, pallor and rash.  Neurological:  Negative for dizziness, facial asymmetry, light-headedness, numbness and headaches.  Psychiatric/Behavioral:  Negative for agitation, behavioral problems, confusion, hallucinations, self-injury, sleep disturbance and suicidal ideas.       Objective:    Physical Exam Vitals and nursing note reviewed.  Constitutional:      General: He is not in acute distress.    Appearance: Normal appearance. He is obese. He is not ill-appearing, toxic-appearing or diaphoretic.  HENT:     Mouth/Throat:     Mouth: Mucous membranes are moist.     Pharynx: Oropharynx is clear. No oropharyngeal exudate or posterior oropharyngeal erythema.  Eyes:     General: No scleral icterus.       Right eye: No discharge.        Left eye: No discharge.     Extraocular Movements: Extraocular movements intact.     Conjunctiva/sclera: Conjunctivae normal.  Cardiovascular:     Rate and Rhythm: Normal rate and regular rhythm.      Pulses: Normal pulses.     Heart sounds: Normal heart sounds. No murmur heard.    No friction rub. No gallop.  Pulmonary:     Effort: Pulmonary effort is normal. No respiratory distress.     Breath sounds: Normal breath sounds. No stridor. No wheezing, rhonchi or rales.  Chest:     Chest wall: No tenderness.  Abdominal:     General: There is no distension.     Palpations: Abdomen is soft.     Tenderness: There is no abdominal tenderness. There is no right CVA tenderness, left CVA tenderness or guarding.     Comments: Lipoma on the right side of the abdomen   Musculoskeletal:        General: No swelling, tenderness, deformity or signs of injury.     Right lower leg: No edema.     Left lower leg: No edema.  Skin:    General: Skin is warm and dry.     Capillary Refill: Capillary refill takes less than 2 seconds.  Coloration: Skin is not jaundiced or pale.     Findings: No bruising, erythema or lesion.  Neurological:     Mental Status: He is alert and oriented to person, place, and time.     Motor: No weakness.     Coordination: Coordination normal.     Gait: Gait normal.  Psychiatric:        Mood and Affect: Mood normal.        Behavior: Behavior normal.        Thought Content: Thought content normal.        Judgment: Judgment normal.     BP 138/81   Pulse 62   Ht 5\' 11"  (1.803 m)   Wt 277 lb 9.6 oz (125.9 kg)   SpO2 100%   BMI 38.72 kg/m  Wt Readings from Last 3 Encounters:  05/14/23 277 lb 9.6 oz (125.9 kg)  01/15/23 (!) 303 lb 6.4 oz (137.6 kg)  12/25/22 (!) 309 lb (140.2 kg)    Lab Results  Component Value Date   TSH 2.190 01/15/2023   Lab Results  Component Value Date   WBC 7.4 12/25/2022   HGB 14.5 12/25/2022   HCT 42.8 12/25/2022   MCV 87.7 12/25/2022   PLT 344 12/25/2022   Lab Results  Component Value Date   NA 138 12/25/2022   K 3.7 12/25/2022   CO2 26 12/25/2022   GLUCOSE 106 (H) 12/25/2022   BUN 12 12/25/2022   CREATININE 1.67 (H)  12/25/2022   BILITOT 0.7 12/25/2022   ALKPHOS 53 11/03/2019   AST 21 12/25/2022   ALT 25 12/25/2022   PROT 7.6 12/25/2022   ALBUMIN 3.9 11/03/2019   CALCIUM 9.5 12/25/2022   ANIONGAP 8 11/03/2019   EGFR 54 (L) 12/25/2022   Lab Results  Component Value Date   CHOL 255 (H) 12/25/2022   Lab Results  Component Value Date   HDL 32 (L) 12/25/2022   Lab Results  Component Value Date   LDLCALC 185 (H) 12/25/2022   Lab Results  Component Value Date   TRIG 197 (H) 12/25/2022   Lab Results  Component Value Date   CHOLHDL 8.0 (H) 12/25/2022   Lab Results  Component Value Date   HGBA1C 5.8 (A) 09/16/2022      Assessment & Plan:   Problem List Items Addressed This Visit       Cardiovascular and Mediastinum   Benign hypertension with CKD (chronic kidney disease) stage III (HCC) - Primary    BP Readings from Last 3 Encounters:  05/14/23 138/81  01/15/23 134/79  12/25/22 (!) 155/86       Latest Ref Rng & Units 12/25/2022    3:14 PM 09/23/2022    3:22 PM 09/05/2022    3:55 PM  BMP  Glucose 65 - 99 mg/dL 161  82  88   BUN 7 - 25 mg/dL 12  17  24    Creatinine 0.60 - 1.26 mg/dL 0.96  0.45  4.09   BUN/Creat Ratio 6 - 22 (calc) 7  10  14    Sodium 135 - 146 mmol/L 138  137  136   Potassium 3.5 - 5.3 mmol/L 3.7  4.3  4.4   Chloride 98 - 110 mmol/L 101  104  102   CO2 20 - 32 mmol/L 26  26  27    Calcium 8.6 - 10.3 mg/dL 9.5  9.3  9.4   Patient encouraged to restart lisinopril 10 mg daily with blood pressure goal of less than  130/80. Hold amlodipine for now.  Avoid NSAIDs and other nephrotoxic agent Drink at least 64 ounces of water daily to maintain hydration Engage in regular moderate to vigorous exercise at least 150 minutes weekly, DASH diet advised Checking BMP      Relevant Orders   Basic Metabolic Panel     Other   Obesity    Wt Readings from Last 3 Encounters:  05/14/23 277 lb 9.6 oz (125.9 kg)  01/15/23 (!) 303 lb 6.4 oz (137.6 kg)  12/25/22 (!) 309 lb  (140.2 kg)  Patient has lost about 26 pounds since last visit Currently taking phentermine prescribed by weight management, also following a diet plan and exercising daily Patient congratulated on his efforts at losing weight He was counseled on low-carb modified diet Engage in regular moderate to vigorous exercises at least 150 minutes weekly.      Relevant Medications   phentermine 30 MG capsule   Hyperlipidemia    Lab Results  Component Value Date   CHOL 255 (H) 12/25/2022   HDL 32 (L) 12/25/2022   LDLCALC 185 (H) 12/25/2022   TRIG 197 (H) 12/25/2022   CHOLHDL 8.0 (H) 12/25/2022  Currently not taking atorvastatin Rechecking lipid panel since he has been losing weight If LDL remains elevated will encourage patient to start taking atorvastatin has ordered  Plan discussed with the patient      Relevant Orders   Lipid panel   Lipoma    Noted on Ct scan done in 2020  1. 7.2 x 7.3 x 7 cm in the subcutaneous fatty tissues along the lateral aspect of the right abdomen has an appearance consistent with a simple lipoma.  Patient is considering surgery referral placed to general surgery      Relevant Orders   Ambulatory referral to General Surgery   Vitamin D deficiency    Last vitamin D Lab Results  Component Value Date   VD25OH 6.8 (L) 01/15/2023  Not taking vitamin D supplement Re Checking vitamin D levels today      Relevant Orders   VITAMIN D 25 Hydroxy (Vit-D Deficiency, Fractures)    No orders of the defined types were placed in this encounter.   Follow-up: Return in about 3 months (around 08/14/2023) for FASTING LABS THIS WEEK.    Donell Beers, FNP

## 2023-05-16 ENCOUNTER — Other Ambulatory Visit: Payer: Self-pay

## 2023-05-19 ENCOUNTER — Other Ambulatory Visit: Payer: Self-pay

## 2023-05-19 DIAGNOSIS — E559 Vitamin D deficiency, unspecified: Secondary | ICD-10-CM

## 2023-05-19 DIAGNOSIS — E782 Mixed hyperlipidemia: Secondary | ICD-10-CM

## 2023-05-20 ENCOUNTER — Other Ambulatory Visit: Payer: Self-pay | Admitting: Nurse Practitioner

## 2023-05-20 DIAGNOSIS — N1831 Chronic kidney disease, stage 3a: Secondary | ICD-10-CM

## 2023-05-20 LAB — VITAMIN D 25 HYDROXY (VIT D DEFICIENCY, FRACTURES): Vit D, 25-Hydroxy: 14.7 ng/mL — ABNORMAL LOW (ref 30.0–100.0)

## 2023-05-20 LAB — LIPID PANEL
Chol/HDL Ratio: 7.8 ratio — ABNORMAL HIGH (ref 0.0–5.0)
Cholesterol, Total: 265 mg/dL — ABNORMAL HIGH (ref 100–199)
HDL: 34 mg/dL — ABNORMAL LOW (ref >39)
LDL Chol Calc (NIH): 192 mg/dL — ABNORMAL HIGH (ref 0–99)
Triglycerides: 203 mg/dL — ABNORMAL HIGH (ref 0–149)
VLDL Cholesterol Cal: 39 mg/dL (ref 5–40)

## 2023-05-21 LAB — SPECIMEN STATUS REPORT

## 2023-05-21 LAB — BASIC METABOLIC PANEL
BUN: 11 mg/dL (ref 6–20)
CO2: 21 mmol/L (ref 20–29)
Chloride: 101 mmol/L (ref 96–106)
Glucose: 97 mg/dL (ref 70–99)
eGFR: 60 mL/min/{1.73_m2} (ref >59)

## 2023-05-24 LAB — BASIC METABOLIC PANEL
BUN/Creatinine Ratio: 7 — ABNORMAL LOW (ref 9–20)
Calcium: 9.5 mg/dL (ref 8.7–10.2)
Creatinine, Ser: 1.52 mg/dL — ABNORMAL HIGH (ref 0.76–1.27)
Potassium: 3.7 mmol/L (ref 3.5–5.2)
Sodium: 138 mmol/L (ref 134–144)

## 2023-07-24 ENCOUNTER — Other Ambulatory Visit: Payer: Self-pay

## 2023-07-24 ENCOUNTER — Ambulatory Visit: Payer: Self-pay

## 2023-07-28 ENCOUNTER — Other Ambulatory Visit: Payer: Self-pay

## 2023-07-28 ENCOUNTER — Encounter: Payer: Self-pay | Admitting: Infectious Disease

## 2023-07-28 ENCOUNTER — Other Ambulatory Visit: Payer: Self-pay | Admitting: Nurse Practitioner

## 2023-07-28 ENCOUNTER — Ambulatory Visit (INDEPENDENT_AMBULATORY_CARE_PROVIDER_SITE_OTHER): Payer: Self-pay | Admitting: Infectious Disease

## 2023-07-28 VITALS — BP 135/81 | HR 68 | Temp 98.3°F | Ht 71.0 in | Wt 279.0 lb

## 2023-07-28 DIAGNOSIS — I129 Hypertensive chronic kidney disease with stage 1 through stage 4 chronic kidney disease, or unspecified chronic kidney disease: Secondary | ICD-10-CM

## 2023-07-28 DIAGNOSIS — E782 Mixed hyperlipidemia: Secondary | ICD-10-CM

## 2023-07-28 DIAGNOSIS — B20 Human immunodeficiency virus [HIV] disease: Secondary | ICD-10-CM

## 2023-07-28 DIAGNOSIS — K602 Anal fissure, unspecified: Secondary | ICD-10-CM

## 2023-07-28 DIAGNOSIS — E669 Obesity, unspecified: Secondary | ICD-10-CM

## 2023-07-28 HISTORY — DX: Anal fissure, unspecified: K60.2

## 2023-07-28 MED ORDER — NITROGLYCERIN 0.4 % RE OINT: TOPICAL_OINTMENT | RECTAL | Status: DC

## 2023-07-28 MED ORDER — BIKTARVY 50-200-25 MG PO TABS
1.0000 | ORAL_TABLET | Freq: Every day | ORAL | 11 refills | Status: DC
Start: 2023-07-28 — End: 2024-02-25

## 2023-07-28 MED ORDER — NIFEDIPINE 0.3 % OINTMENT: 1.0000 | TOPICAL_OINTMENT | Freq: Four times a day (QID) | CUTANEOUS | 3 refills | Status: DC

## 2023-07-28 MED ORDER — AMLODIPINE BESYLATE 10 MG PO TABS: 10.0000 mg | ORAL_TABLET | Freq: Every day | ORAL | 11 refills | Status: DC

## 2023-07-28 MED ORDER — ATORVASTATIN CALCIUM 10 MG PO TABS
10.0000 mg | ORAL_TABLET | Freq: Every day | ORAL | 0 refills | Status: DC
Start: 2023-07-28 — End: 2023-08-15

## 2023-07-28 MED ORDER — LISINOPRIL 10 MG PO TABS
10.0000 mg | ORAL_TABLET | Freq: Every day | ORAL | 1 refills | Status: AC
Start: 2023-07-28 — End: ?

## 2023-07-28 NOTE — Patient Instructions (Signed)
I want you to purchase Sitz baths from local pharmacy  Continue the stool softener and would also take a fiber supplement

## 2023-07-28 NOTE — Progress Notes (Signed)
Subjective:  Chief complaint: Follow-up for HIV disease and hypertension on medications, complaining of pain when he defecates sometimes blood on toilet paper   Patient ID: Luis Vincent, male    DOB: October 15, 1986, 37 y.o.   MRN: [id number]  HPI  37 year old black man living with HIV that has been well controlled recently on Biktarvy to which she has been highly adherent.  Has been on phentermine and lost 30 pounds.  He is at improvement his blood pressure in one of his meds namely the lisinopril has been dropped.  He tells me that no one been calling the [facility] for several months but that he had extra bottles of medications and has been taking these.  He has had pain with defecation and some blood on the toilet paper so that he is now using wipes to wipe himself after going to the bathroom.         Past Medical History:  Diagnosis Date   CKD (chronic kidney disease) 08/07/2022   Headache    Hemorrhoid 02/27/2022   HIV infection (HCC)    HTN (hypertension) 08/07/2022   Immune deficiency disorder (HCC)    Obesity 08/01/2021   Routine screening for STI (sexually transmitted infection) 08/07/2022   Sleepiness 08/01/2021   Syphilis    Vaccine counseling 08/07/2022    No past surgical history on file.  Family History  Problem Relation Age of Onset   Lung cancer Father    Emphysema Father    Hypertension Sister    Hypertension Maternal Grandmother       Social History   Socioeconomic History   Marital status: Single    Spouse name: Not on file   Number of children: Not on file   Years of education: Not on file   Highest education level: Not on file  Occupational History   Not on file  Tobacco Use   Smoking status: Never   Smokeless tobacco: Never  Substance and Sexual Activity   Alcohol use: No   Drug use: No    Comment: declined condoms   Sexual activity: Not Currently    Comment: declined condoms  Other Topics Concern   Not on file  Social History  Narrative   Works as a [employer].    Social Determinants of Health   Financial Resource Strain: Not on file  Food Insecurity: Not on file  Transportation Needs: Not on file  Physical Activity: Not on file  Stress: Not on file  Social Connections: Not on file    No Known Allergies   Current Outpatient Medications:    atorvastatin (LIPITOR) 10 MG tablet, Take 1 tablet (10 mg total) by mouth daily. (Patient not taking: Reported on 05/14/2023), Disp: 90 tablet, Rfl: 0   Vitamin D, Ergocalciferol, (DRISDOL) 1.25 MG (50000 UNIT) CAPS capsule, Take 1 capsule (50,000 Units total) by mouth every 7 (seven) days. (Patient not taking: Reported on 05/14/2023), Disp: 8 capsule, Rfl: 0   amLODipine (NORVASC) 10 MG tablet, Take 1 tablet (10 mg total) by mouth daily. (Patient not taking: Reported on 05/14/2023), Disp: 30 tablet, Rfl: 11   bictegravir-emtricitabine-tenofovir AF (BIKTARVY) 50-200-25 MG TABS tablet, Take 1 tablet by mouth daily., Disp: 30 tablet, Rfl: 11   lisinopril (PRINIVIL) 10 MG tablet, Take 1 tablet (10 mg total) by mouth daily. (Patient not taking: Reported on 05/14/2023), Disp: 30 tablet, Rfl: 11   phentermine 30 MG capsule, Take 30 mg by mouth every morning., Disp: , Rfl:     Review  of Systems  Constitutional:  Negative for activity change, appetite change, chills, diaphoresis, fatigue, fever and unexpected weight change.  HENT:  Negative for congestion, rhinorrhea, sinus pressure, sneezing, sore throat and trouble swallowing.   Eyes:  Negative for photophobia and visual disturbance.  Respiratory:  Negative for cough, chest tightness, shortness of breath, wheezing and stridor.   Cardiovascular:  Negative for chest pain, palpitations and leg swelling.  Gastrointestinal:  Negative for abdominal distention, abdominal pain, anal bleeding, blood in stool, constipation, diarrhea, nausea and vomiting.  Genitourinary:  Negative for difficulty urinating, dysuria, flank pain and hematuria.   Musculoskeletal:  Negative for arthralgias, back pain, gait problem, joint swelling and myalgias.  Skin:  Negative for color change, pallor, rash and wound.  Neurological:  Negative for dizziness, tremors, weakness and light-headedness.  Hematological:  Negative for adenopathy. Does not bruise/bleed easily.  Psychiatric/Behavioral:  Negative for agitation, behavioral problems, confusion, decreased concentration, dysphoric mood and sleep disturbance.        Objective:   Physical Exam Exam conducted with a chaperone present.  Constitutional:      Appearance: He is well-developed.  HENT:     Head: Normocephalic and atraumatic.  Eyes:     Conjunctiva/sclera: Conjunctivae normal.  Cardiovascular:     Rate and Rhythm: Normal rate and regular rhythm.  Pulmonary:     Effort: Pulmonary effort is normal. No respiratory distress.     Breath sounds: No wheezing.  Abdominal:     General: There is no distension.     Palpations: Abdomen is soft.  Genitourinary:    Rectum: Anal fissure present.     Comments: Anal pap smear obtained Musculoskeletal:        General: No tenderness. Normal range of motion.     Cervical back: Normal range of motion and neck supple.  Skin:    General: Skin is warm and dry.     Coloration: Skin is not pale.     Findings: No erythema or rash.  Neurological:     Mental Status: He is alert and oriented to person, place, and time.          Assessment & Plan:     HIV disease:  I will add order HIV viral load CD4 count CBC with differential CMP, RPR GC and chlamydia and I will continue  Luis Vincent's Biktarvy prescription   Anal fissue: Sitz bath's, stool softener to be continued along with fiber supplementation.  I have also tried to send in a prescription for topical nitroglycerin.  Prevention anal cancer screening anal Pap smear obtained  Lipidemia continue atorvastatin  Hypertension continue his amlodipine  Chronic and disease renal function  has been relatively stable recently  Obesity his weight lost weight with phentermine continue to follow with PCP

## 2023-07-28 NOTE — Progress Notes (Signed)
I have called the patient to verify his current medications.  He is not taking amlodipine but takes lisinopril 10 mg daily as discussed. Continue current medications.lisinopril refilled today

## 2023-07-29 LAB — CYTOLOGY - PAP: Diagnosis: NEGATIVE

## 2023-07-29 LAB — T-HELPER CELLS (CD4) COUNT (NOT AT ARMC)
CD4 % Helper T Cell: 22 % — ABNORMAL LOW (ref 33–65)
CD4 T Cell Abs: 522 /uL (ref 400–1790)

## 2023-07-29 LAB — URINE CYTOLOGY ANCILLARY ONLY
Chlamydia: NEGATIVE
Comment: NEGATIVE
Comment: NORMAL
Neisseria Gonorrhea: NEGATIVE

## 2023-07-31 LAB — LIPID PANEL
Cholesterol: 213 mg/dL — ABNORMAL HIGH (ref <200)
HDL: 35 mg/dL — ABNORMAL LOW (ref >=40)
LDL Cholesterol (Calc): 142 mg/dL — ABNORMAL HIGH
Non-HDL Cholesterol (Calc): 178 mg/dL — ABNORMAL HIGH (ref <130)
Total CHOL/HDL Ratio: 6.1 (calc) — ABNORMAL HIGH (ref <5.0)
Triglycerides: 219 mg/dL — ABNORMAL HIGH (ref <150)

## 2023-07-31 LAB — COMPLETE METABOLIC PANEL WITH GFR
AG Ratio: 1.7 (calc) (ref 1.0–2.5)
ALT: 20 U/L (ref 9–46)
AST: 18 U/L (ref 10–40)
Albumin: 4.3 g/dL (ref 3.6–5.1)
Alkaline phosphatase (APISO): 48 U/L (ref 36–130)
BUN/Creatinine Ratio: 7 (calc) (ref 6–22)
BUN: 11 mg/dL (ref 7–25)
CO2: 28 mmol/L (ref 20–32)
Calcium: 9.2 mg/dL (ref 8.6–10.3)
Chloride: 104 mmol/L (ref 98–110)
Creat: 1.64 mg/dL — ABNORMAL HIGH (ref 0.60–1.26)
Globulin: 2.6 g/dL (ref 1.9–3.7)
Glucose, Bld: 85 mg/dL (ref 65–99)
Potassium: 4.3 mmol/L (ref 3.5–5.3)
Sodium: 140 mmol/L (ref 135–146)
Total Bilirubin: 0.8 mg/dL (ref 0.2–1.2)
Total Protein: 6.9 g/dL (ref 6.1–8.1)
eGFR: 55 mL/min/{1.73_m2} — ABNORMAL LOW (ref >=60)

## 2023-07-31 LAB — CBC WITH DIFFERENTIAL/PLATELET
Absolute Monocytes: 289 {cells}/uL (ref 200–950)
Basophils Absolute: 59 cells/uL (ref 0–200)
Basophils Relative: 1.2 %
Eosinophils Absolute: 221 {cells}/uL (ref 15–500)
Eosinophils Relative: 4.5 %
HCT: 43.9 % (ref 38.5–50.0)
Hemoglobin: 14.6 g/dL (ref 13.2–17.1)
Lymphs Abs: 2563 cells/uL (ref 850–3900)
MCH: 29.4 pg (ref 27.0–33.0)
MCHC: 33.3 g/dL (ref 32.0–36.0)
MCV: 88.3 fL (ref 80.0–100.0)
MPV: 9.7 fL (ref 7.5–12.5)
Monocytes Relative: 5.9 %
Neutro Abs: 1769 {cells}/uL (ref 1500–7800)
Neutrophils Relative %: 36.1 %
Platelets: 302 10*3/uL (ref 140–400)
RBC: 4.97 10*6/uL (ref 4.20–5.80)
RDW: 13 % (ref 11.0–15.0)
Total Lymphocyte: 52.3 %
WBC: 4.9 10*3/uL (ref 3.8–10.8)

## 2023-07-31 LAB — HIV RNA, RTPCR W/R GT (RTI, PI,INT)
HIV 1 RNA Quant: <20 {copies}/mL — AB
HIV-1 RNA Quant, Log: <1.3 {Log_copies}/mL — AB

## 2023-07-31 LAB — RPR: RPR Ser Ql: NONREACTIVE

## 2023-08-15 ENCOUNTER — Encounter: Payer: Self-pay | Admitting: Nurse Practitioner

## 2023-08-15 ENCOUNTER — Ambulatory Visit (INDEPENDENT_AMBULATORY_CARE_PROVIDER_SITE_OTHER): Payer: Self-pay | Admitting: Nurse Practitioner

## 2023-08-15 VITALS — BP 140/78 | HR 68 | Temp 97.2°F | Wt 273.2 lb

## 2023-08-15 DIAGNOSIS — E782 Mixed hyperlipidemia: Secondary | ICD-10-CM

## 2023-08-15 DIAGNOSIS — M545 Low back pain, unspecified: Secondary | ICD-10-CM | POA: Insufficient documentation

## 2023-08-15 DIAGNOSIS — N183 Chronic kidney disease, stage 3 unspecified: Secondary | ICD-10-CM

## 2023-08-15 DIAGNOSIS — Z6838 Body mass index (BMI) 38.0-38.9, adult: Secondary | ICD-10-CM

## 2023-08-15 DIAGNOSIS — G8929 Other chronic pain: Secondary | ICD-10-CM

## 2023-08-15 DIAGNOSIS — I129 Hypertensive chronic kidney disease with stage 1 through stage 4 chronic kidney disease, or unspecified chronic kidney disease: Secondary | ICD-10-CM

## 2023-08-15 MED ORDER — ATORVASTATIN CALCIUM 20 MG PO TABS: 20.0000 mg | ORAL_TABLET | Freq: Every day | ORAL | 1 refills | Status: AC

## 2023-08-15 NOTE — Patient Instructions (Signed)
1. Benign hypertension with CKD (chronic kidney disease) stage III (HCC)  - Ambulatory referral to Nephrology  2. Chronic bilateral low back pain without sciatica Please take Tylenol 650 mg every 6 hours as needed, stretching exercises.  Is encouraged.  Also encouraged to use of heating pad or ice as needed   3. Class 2 severe obesity with serious comorbidity and body mass index (BMI) of 38.0 to 38.9 in adult, unspecified obesity type (HCC)   4. Mixed hyperlipidemia  - atorvastatin (LIPITOR) 20 MG tablet; Take 1 tablet (20 mg total) by mouth daily.  Dispense: 90 tablet; Refill: 1    It is important that you exercise regularly at least 30 minutes 5 times a week as tolerated  Think about what you will eat, plan ahead. Choose " clean, green, fresh or frozen" over canned, processed or packaged foods which are more sugary, salty and fatty. 70 to 75% of food eaten should be vegetables and fruit. Three meals at set times with snacks allowed between meals, but they must be fruit or vegetables. Aim to eat over a 12 hour period , example 7 am to 7 pm, and STOP after  your last meal of the day. Drink water,generally about 64 ounces per day, no other drink is as healthy. Fruit juice is best enjoyed in a healthy way, by EATING the fruit.  Thanks for choosing [facility] we consider it a privelige to serve you.

## 2023-08-15 NOTE — Progress Notes (Signed)
Established Patient Office Visit  Subjective:  Patient ID: Anthone Vautour, male    DOB: 1986-06-09  Age: 37 y.o. MRN: [id number]  CC:  Chief Complaint  Patient presents with   Hypertension    HPI Jarron Slice is a 37 y.o. male  has a past medical history of Anal fissure (07/28/2023), CKD (chronic kidney disease) (08/07/2022), Headache, Hemorrhoid (02/27/2022), HIV infection (HCC), HTN (hypertension) (08/07/2022), Immune deficiency disorder (HCC), Obesity (08/01/2021), Routine screening for STI (sexually transmitted infection) (08/07/2022), Sleepiness (08/01/2021), Syphilis, and Vaccine counseling (08/07/2022).  Patient presents for follow-up for his chronic medical conditions  Hypertension.  Currently on lisinopril 10 mg daily.  Stated that he has been taking the medication daily but has not taken it today.  He denies chest pain shortness of breath edema  Hyperlipidemia.  Has started taking atorvastatin 20 mg daily since his last visit  Patient denies adverse reactions to current medications  Chronic low back pain .patient complains of intermittent low back pain, is a Naval architect.  No complaints of urinary or bladder incontinence, does not take any medication for his back pain. he denies fever, chills, malaise   Past Medical History:  Diagnosis Date   Anal fissure 07/28/2023   CKD (chronic kidney disease) 08/07/2022   Headache    Hemorrhoid 02/27/2022   HIV infection (HCC)    HTN (hypertension) 08/07/2022   Immune deficiency disorder (HCC)    Obesity 08/01/2021   Routine screening for STI (sexually transmitted infection) 08/07/2022   Sleepiness 08/01/2021   Syphilis    Vaccine counseling 08/07/2022    History reviewed. No pertinent surgical history.  Family History  Problem Relation Age of Onset   Lung cancer Father    Emphysema Father    Hypertension Sister    Hypertension Maternal Grandmother     Social History   Socioeconomic History   Marital status:  Single    Spouse name: Not on file   Number of children: Not on file   Years of education: Not on file   Highest education level: Not on file  Occupational History   Not on file  Tobacco Use   Smoking status: Never   Smokeless tobacco: Never  Substance and Sexual Activity   Alcohol use: No   Drug use: No    Comment: declined condoms   Sexual activity: Not Currently    Comment: declined condoms  Other Topics Concern   Not on file  Social History Narrative   Works as a Naval architect.    Social Determinants of Health   Financial Resource Strain: Not on file  Food Insecurity: Not on file  Transportation Needs: Not on file  Physical Activity: Not on file  Stress: Not on file  Social Connections: Not on file  Intimate Partner Violence: Not on file    Outpatient Medications Prior to Visit  Medication Sig Dispense Refill   bictegravir-emtricitabine-tenofovir AF (BIKTARVY) 50-200-25 MG TABS tablet Take 1 tablet by mouth daily. 30 tablet 11   lisinopril (PRINIVIL) 10 MG tablet Take 1 tablet (10 mg total) by mouth daily. 90 tablet 1   Nitroglycerin 0.4 % OINT Apply to fissue twice daily x 4 weeks 30 g G   phentermine 30 MG capsule Take 30 mg by mouth every morning.     Vitamin D, Ergocalciferol, (DRISDOL) 1.25 MG (50000 UNIT) CAPS capsule Take 1 capsule (50,000 Units total) by mouth every 7 (seven) days. 8 capsule 0   atorvastatin (LIPITOR) 10 MG tablet Take 1 tablet (  10 mg total) by mouth daily. 90 tablet 0   No facility-administered medications prior to visit.    No Known Allergies  ROS Review of Systems  Constitutional:  Negative for activity change, appetite change, chills, diaphoresis, fatigue, fever and unexpected weight change.  HENT:  Negative for congestion, dental problem, drooling and ear discharge.   Eyes:  Negative for pain, discharge, redness and itching.  Respiratory:  Negative for apnea, cough, choking, chest tightness, shortness of breath and wheezing.    Cardiovascular: Negative.  Negative for chest pain, palpitations and leg swelling.  Gastrointestinal:  Negative for abdominal distention, abdominal pain, anal bleeding, blood in stool, constipation, diarrhea and vomiting.  Endocrine: Negative for polydipsia, polyphagia and polyuria.  Genitourinary:  Negative for difficulty urinating, flank pain, frequency and genital sores.  Musculoskeletal:  Positive for back pain. Negative for arthralgias, gait problem and joint swelling.  Skin:  Negative for color change, pallor and rash.  Neurological:  Negative for dizziness, facial asymmetry, light-headedness, numbness and headaches.  Psychiatric/Behavioral:  Negative for agitation, behavioral problems, confusion, hallucinations, self-injury, sleep disturbance and suicidal ideas.       Objective:    Physical Exam Vitals and nursing note reviewed.  Constitutional:      General: He is not in acute distress.    Appearance: Normal appearance. He is obese. He is not ill-appearing, toxic-appearing or diaphoretic.  HENT:     Mouth/Throat:     Mouth: Mucous membranes are moist.     Pharynx: Oropharynx is clear. No oropharyngeal exudate or posterior oropharyngeal erythema.  Eyes:     General: No scleral icterus.       Right eye: No discharge.        Left eye: No discharge.     Extraocular Movements: Extraocular movements intact.     Conjunctiva/sclera: Conjunctivae normal.  Cardiovascular:     Rate and Rhythm: Normal rate and regular rhythm.     Pulses: Normal pulses.     Heart sounds: Normal heart sounds. No murmur heard.    No friction rub. No gallop.  Pulmonary:     Effort: Pulmonary effort is normal. No respiratory distress.     Breath sounds: Normal breath sounds. No stridor. No wheezing, rhonchi or rales.  Chest:     Chest wall: No tenderness.  Abdominal:     General: There is no distension.     Palpations: Abdomen is soft.     Tenderness: There is no abdominal tenderness. There is no  right CVA tenderness, left CVA tenderness or guarding.  Musculoskeletal:        General: No swelling, tenderness, deformity or signs of injury.     Right lower leg: No edema.     Left lower leg: No edema.  Skin:    General: Skin is warm and dry.     Capillary Refill: Capillary refill takes less than 2 seconds.     Coloration: Skin is not jaundiced or pale.     Findings: No bruising, erythema or lesion.  Neurological:     Mental Status: He is alert and oriented to person, place, and time.     Motor: No weakness.     Coordination: Coordination normal.     Gait: Gait normal.  Psychiatric:        Mood and Affect: Mood normal.        Behavior: Behavior normal.        Thought Content: Thought content normal.        Judgment:  Judgment normal.     BP (!) 140/78   Pulse 68   Temp (!) 97.2 F (36.2 C)   Wt 273 lb 3.2 oz (123.9 kg)   SpO2 100%   BMI 38.10 kg/m  Wt Readings from Last 3 Encounters:  08/15/23 273 lb 3.2 oz (123.9 kg)  07/28/23 279 lb (126.6 kg)  05/14/23 277 lb 9.6 oz (125.9 kg)    Lab Results  Component Value Date   TSH 2.190 01/15/2023   Lab Results  Component Value Date   WBC 4.9 07/28/2023   HGB 14.6 07/28/2023   HCT 43.9 07/28/2023   MCV 88.3 07/28/2023   PLT 302 07/28/2023   Lab Results  Component Value Date   NA 140 07/28/2023   K 4.3 07/28/2023   CO2 28 07/28/2023   GLUCOSE 85 07/28/2023   BUN 11 07/28/2023   CREATININE 1.64 (H) 07/28/2023   BILITOT 0.8 07/28/2023   ALKPHOS 53 11/03/2019   AST 18 07/28/2023   ALT 20 07/28/2023   PROT 6.9 07/28/2023   ALBUMIN 3.9 11/03/2019   CALCIUM 9.2 07/28/2023   ANIONGAP 8 11/03/2019   EGFR 55 (L) 07/28/2023   Lab Results  Component Value Date   CHOL 213 (H) 07/28/2023   Lab Results  Component Value Date   HDL 35 (L) 07/28/2023   Lab Results  Component Value Date   LDLCALC 142 (H) 07/28/2023   Lab Results  Component Value Date   TRIG 219 (H) 07/28/2023   Lab Results  Component Value  Date   CHOLHDL 6.1 (H) 07/28/2023   Lab Results  Component Value Date   HGBA1C 5.8 (A) 09/16/2022      Assessment & Plan:   Problem List Items Addressed This Visit       Cardiovascular and Mediastinum   Benign hypertension with CKD (chronic kidney disease) stage III (HCC) - Primary    BP Readings from Last 3 Encounters:  08/15/23 (!) 140/78  07/28/23 135/81  05/14/23 138/81  Blood pressure is elevated in the office today, stated that he has not taking his lisinopril Encouraged to take lisinopril 10 mg daily DASH diet advised, need for moderate to vigorous exercise at least 150 minutes weekly discussed Will refer patient to nephrology due to his CKD stage III Avoid NSAIDs and other nephrotoxic agents Drink at least 64 ounces of water daily to maintain hydration   Lab Results  Component Value Date   NA 140 07/28/2023   K 4.3 07/28/2023   CO2 28 07/28/2023   GLUCOSE 85 07/28/2023   BUN 11 07/28/2023   CREATININE 1.64 (H) 07/28/2023   CALCIUM 9.2 07/28/2023   EGFR 55 (L) 07/28/2023   GFRNONAA 54 (L) 04/18/2021         Relevant Medications   atorvastatin (LIPITOR) 20 MG tablet   Other Relevant Orders   Ambulatory referral to Nephrology     Other   Obesity    Wt Readings from Last 3 Encounters:  08/15/23 273 lb 3.2 oz (123.9 kg)  07/28/23 279 lb (126.6 kg)  05/14/23 277 lb 9.6 oz (125.9 kg)  He has lost 4 pounds since last visit Goes to the gym 3 times a week, following a heart healthy diet Takes phentermine 30 mg daily medication managed by bariatric clinic Continue medication, encouraged to engage in regular moderate to vigorous exercises at least 150 minutes weekly      Hyperlipidemia    Lab Results  Component Value Date   CHOL 213 (  H) 07/28/2023   HDL 35 (L) 07/28/2023   LDLCALC 142 (H) 07/28/2023   TRIG 219 (H) 07/28/2023   CHOLHDL 6.1 (H) 07/28/2023  LDL has improved but remains elevated Start taking atorvastatin 20 mg daily We will recheck labs at  next visit Avoid fatty fried foods, lose weight      Relevant Medications   atorvastatin (LIPITOR) 20 MG tablet   Chronic bilateral low back pain without sciatica    Stretching exercises, application of heat and ice encouraged May take OTC Tylenol 650 mg as needed       Meds ordered this encounter  Medications   atorvastatin (LIPITOR) 20 MG tablet    Sig: Take 1 tablet (20 mg total) by mouth daily.    Dispense:  90 tablet    Refill:  1    Follow-up: Return in about 6 weeks (around 09/26/2023) for HYPERLIPIDEMIA.    Donell Beers, FNP

## 2023-08-15 NOTE — Assessment & Plan Note (Signed)
BP Readings from Last 3 Encounters:  08/15/23 (!) 140/78  07/28/23 135/81  05/14/23 138/81  Blood pressure is elevated in the office today, stated that he has not taking his lisinopril Encouraged to take lisinopril 10 mg daily DASH diet advised, need for moderate to vigorous exercise at least 150 minutes weekly discussed Will refer patient to nephrology due to his CKD stage III Avoid NSAIDs and other nephrotoxic agents Drink at least 64 ounces of water daily to maintain hydration   Lab Results  Component Value Date   NA 140 07/28/2023   K 4.3 07/28/2023   CO2 28 07/28/2023   GLUCOSE 85 07/28/2023   BUN 11 07/28/2023   CREATININE 1.64 (H) 07/28/2023   CALCIUM 9.2 07/28/2023   EGFR 55 (L) 07/28/2023   GFRNONAA 54 (L) 04/18/2021

## 2023-08-15 NOTE — Assessment & Plan Note (Signed)
Lab Results  Component Value Date   CHOL 213 (H) 07/28/2023   HDL 35 (L) 07/28/2023   LDLCALC 142 (H) 07/28/2023   TRIG 219 (H) 07/28/2023   CHOLHDL 6.1 (H) 07/28/2023  LDL has improved but remains elevated Start taking atorvastatin 20 mg daily We will recheck labs at next visit Avoid fatty fried foods, lose weight

## 2023-08-15 NOTE — Assessment & Plan Note (Addendum)
Wt Readings from Last 3 Encounters:  08/15/23 273 lb 3.2 oz (123.9 kg)  07/28/23 279 lb (126.6 kg)  05/14/23 277 lb 9.6 oz (125.9 kg)  He has lost 4 pounds since last visit Goes to the gym 3 times a week, following a heart healthy diet Takes phentermine 30 mg daily medication managed by bariatric clinic Continue medication, encouraged to engage in regular moderate to vigorous exercises at least 150 minutes weekly

## 2023-08-15 NOTE — Assessment & Plan Note (Signed)
Stretching exercises, application of heat and ice encouraged May take OTC Tylenol 650 mg as needed

## 2023-08-22 ENCOUNTER — Telehealth: Payer: Self-pay

## 2023-08-22 NOTE — Patient Outreach (Signed)
Care Guide Note  08/22/2023 Name: Luis Vincent MRN: [id number] DOB: 1986/01/26  Referred by: Donell Beers, FNP Reason for referral : patient outreach (Outreach to schedule with RPH. )   Luis Vincent is a 37 y.o. year old male who is a primary care patient of Donell Beers, FNP. Luis Vincent was referred to the pharmacist for assistance related to HTN.    Successful contact was made with the patient to discuss pharmacy services including being ready for the pharmacist to call at least 5 minutes before the scheduled appointment time, to have medication bottles and any blood sugar or blood pressure readings ready for review. The patient agreed to meet with the pharmacist via with the pharmacist via telephone visit on (date/time).  Call scheduled with Lynnda Shields, [facility] for Monday, August 25, 2023 at 2:00 pm.

## 2023-08-25 ENCOUNTER — Other Ambulatory Visit: Payer: Self-pay | Admitting: Pharmacist

## 2023-08-25 NOTE — Progress Notes (Unsigned)
Patient appearing on report for [employer] North Metric - Hypertension Control report due to last documented ambulatory blood pressure of 140/78 on 08/15/23. Next appointment with PCP is 09/26/23   Outreached patient to discuss hypertension control and medication management.   Current antihypertensives: lisinopril 10mg  daily   Patient does not have an automated upper arm home BP machine.  Current blood pressure readings: not yet checking   Patient denies hypotensive signs and symptoms including dizziness, lightheadedness.  Patient denies hypertensive symptoms including headache, chest pain, shortness of breath.  Patient denies side effects related to medication     Assessment/Plan: - Currently uncontrolled, but  very close to goal - may be at goal for home readings. - - Reviewed goal blood pressure <140/90 - Counseled on long term microvascular and macrovascular complications of uncontrolled hypertension - Reviewed appropriate home BP monitoring technique (avoid caffeine, smoking, and exercise for 30 minutes before checking, rest for at least 5 minutes before taking BP, sit with feet flat on the floor and back against a hard surface, uncross legs, and rest arm on flat surface) - Reviewed to check blood pressure 2-3x per week, document, and provide at next provider visit - Recommend continue current regimen, obtain BP cuff (counseled patient on omron brand upper arm cuff, states he is able to purchase OTC and will do so), document readings for [place] to discuss in 1 week  Lynnda Shields, PharmD, BCPS Clinical Pharmacist [facility]

## 2023-09-02 ENCOUNTER — Other Ambulatory Visit: Payer: Self-pay | Admitting: Pharmacist

## 2023-09-02 ENCOUNTER — Telehealth: Payer: Self-pay | Admitting: Pharmacist

## 2023-09-02 NOTE — Progress Notes (Signed)
09/02/2023 Name: Luis Vincent MRN: [id number] DOB: 11/28/1986  Attempted to contact patient x2 for scheduled appointment for medication management - true north metric, hypertension. Left HIPAA compliant message for patient to return my call at their convenience.   Lynnda Shields, PharmD, BCPS Clinical Pharmacist [facility]

## 2023-09-26 ENCOUNTER — Ambulatory Visit: Payer: Self-pay | Admitting: Nurse Practitioner

## 2024-01-02 ENCOUNTER — Telehealth: Payer: Self-pay | Admitting: Nurse Practitioner

## 2024-01-02 NOTE — Telephone Encounter (Signed)
Copied from CRM [id number]. Topic: Clinical - Medication Refill >> Jan 02, 2024 12:46 PM Patsy Lager T wrote: Most Recent Primary Care Visit:  Provider: Donell Beers  Department: [facility]  Visit Type: OFFICE VISIT  Date: 08/15/2023  Medication: Ozempic   Has the patient contacted their pharmacy? No  Is this the correct pharmacy for this prescription? Yes  This is the patient's preferred pharmacy:  [id number] - [place], [place] - [address] AT [facility] OF [address] [place] [address] Phone: [phone] Fax: [phone]   Has the prescription been filled recently? No  Is the patient out of the medication? No  Has the patient been seen for an appointment in the last year OR does the patient have an upcoming appointment? Yes  Can we respond through MyChart? No  Agent: Please be advised that Rx refills may take up to 3 business days. We ask that you follow-up with your pharmacy.  Provider never prescribed medication as patient has been paying out of pocket

## 2024-01-13 ENCOUNTER — Telehealth: Payer: Self-pay | Admitting: Nurse Practitioner

## 2024-01-13 ENCOUNTER — Telehealth: Payer: Self-pay

## 2024-01-13 NOTE — Telephone Encounter (Signed)
Callled no answer. LVM

## 2024-01-13 NOTE — Telephone Encounter (Signed)
Communication  Reason for CRM: Patient received a call from Nurse Cala Bradford and would like a call back//Patient is trying to get weight loss medication covered//

## 2024-01-13 NOTE — Telephone Encounter (Signed)
Per provider  As far as I know he is on phentermine, managed by his weight management specialist, not sure if his weight management specialist started him on Wegovy.  Please have him follow-up with them.

## 2024-01-15 NOTE — Telephone Encounter (Signed)
 Pt sent message , kh

## 2024-01-28 ENCOUNTER — Ambulatory Visit: Payer: Self-pay | Admitting: Infectious Disease

## 2024-02-25 ENCOUNTER — Other Ambulatory Visit: Payer: Self-pay

## 2024-02-25 ENCOUNTER — Ambulatory Visit (INDEPENDENT_AMBULATORY_CARE_PROVIDER_SITE_OTHER): Payer: Self-pay | Admitting: Infectious Disease

## 2024-02-25 ENCOUNTER — Encounter: Payer: Self-pay | Admitting: Infectious Disease

## 2024-02-25 VITALS — BP 137/90 | HR 66 | Temp 98.3°F | Wt 275.6 lb

## 2024-02-25 DIAGNOSIS — E669 Obesity, unspecified: Secondary | ICD-10-CM

## 2024-02-25 DIAGNOSIS — A5149 Other secondary syphilitic conditions: Secondary | ICD-10-CM

## 2024-02-25 DIAGNOSIS — Z79899 Other long term (current) drug therapy: Secondary | ICD-10-CM

## 2024-02-25 DIAGNOSIS — E782 Mixed hyperlipidemia: Secondary | ICD-10-CM

## 2024-02-25 DIAGNOSIS — B20 Human immunodeficiency virus [HIV] disease: Secondary | ICD-10-CM

## 2024-02-25 DIAGNOSIS — Z6838 Body mass index (BMI) 38.0-38.9, adult: Secondary | ICD-10-CM

## 2024-02-25 DIAGNOSIS — Z113 Encounter for screening for infections with a predominantly sexual mode of transmission: Secondary | ICD-10-CM

## 2024-02-25 DIAGNOSIS — I129 Hypertensive chronic kidney disease with stage 1 through stage 4 chronic kidney disease, or unspecified chronic kidney disease: Secondary | ICD-10-CM

## 2024-02-25 MED ORDER — BIKTARVY 50-200-25 MG PO TABS: 1.0000 | ORAL_TABLET | Freq: Every day | ORAL | 11 refills | Status: DC

## 2024-02-25 NOTE — Progress Notes (Signed)
 Subjective:  Chief complaint: follow-up for HIV disease on medications   Patient ID: Luis Vincent, male    DOB: 02-01-1986, 38 y.o.   MRN: [id number]  HPI  Discussed the use of AI scribe software for clinical note transcription with the patient, who gave verbal consent to proceed.  History of Present Illness   Luis Vincent, a patient with a history of HIV and hyperlipidemia, presents for a follow-up visit. He was previously on phentermine for weight loss, but reports that it stopped working. He has since started semaglutide, but has noticed an increase in his appetite, which he attributes to the low dose he is currently on.  In terms of his HIV management, he is on Biktarvy, and his viral load was undetectable at his last visit. His CD4 count was also within a healthy range. He receives his Radio producer from a Science writer in [place].  For his hyperlipidemia, he is on atorvastatin. He reports being up to date on his vaccines.       Past Medical History:  Diagnosis Date   Anal fissure 07/28/2023   CKD (chronic kidney disease) 08/07/2022   Headache    Hemorrhoid 02/27/2022   HIV infection (HCC)    HTN (hypertension) 08/07/2022   Immune deficiency disorder (HCC)    Obesity 08/01/2021   Routine screening for STI (sexually transmitted infection) 08/07/2022   Sleepiness 08/01/2021   Syphilis    Vaccine counseling 08/07/2022    No past surgical history on file.  Family History  Problem Relation Age of Onset   Lung cancer Father    Emphysema Father    Hypertension Sister    Hypertension Maternal Grandmother       Social History   Socioeconomic History   Marital status: Single    Spouse name: Not on file   Number of children: Not on file   Years of education: Not on file   Highest education level: Not on file  Occupational History   Not on file  Tobacco Use   Smoking status: Never   Smokeless tobacco: Never  Substance and Sexual Activity   Alcohol use: No    Drug use: No    Comment: declined condoms   Sexual activity: Not Currently    Comment: declined condoms  Other Topics Concern   Not on file  Social History Narrative   Works as a Naval architect.    Social Drivers of Corporate investment banker Strain: Not on file  Food Insecurity: Not on file  Transportation Needs: Not on file  Physical Activity: Not on file  Stress: Not on file  Social Connections: Not on file    No Known Allergies   Current Outpatient Medications:    atorvastatin (LIPITOR) 20 MG tablet, Take 1 tablet (20 mg total) by mouth daily., Disp: 90 tablet, Rfl: 1   bictegravir-emtricitabine-tenofovir AF (BIKTARVY) 50-200-25 MG TABS tablet, Take 1 tablet by mouth daily., Disp: 30 tablet, Rfl: 11   Semaglutide-Weight Management 0.25 MG/0.5ML SOAJ, Inject 0.25 mg into the skin., Disp: , Rfl:    lisinopril (PRINIVIL) 10 MG tablet, Take 1 tablet (10 mg total) by mouth daily. (Patient not taking: Reported on 02/25/2024), Disp: 90 tablet, Rfl: 1   phentermine 30 MG capsule, Take 30 mg by mouth every morning. (Patient not taking: Reported on 02/25/2024), Disp: , Rfl:    Vitamin D, Ergocalciferol, (DRISDOL) 1.25 MG (50000 UNIT) CAPS capsule, Take 1 capsule (50,000 Units total) by mouth every 7 (seven) days. (Patient not taking:  Reported on 02/25/2024), Disp: 8 capsule, Rfl: 0    Review of Systems  Constitutional:  Negative for activity change, appetite change, chills, diaphoresis, fatigue, fever and unexpected weight change.  HENT:  Negative for congestion, rhinorrhea, sinus pressure, sneezing, sore throat and trouble swallowing.   Eyes:  Negative for photophobia and visual disturbance.  Respiratory:  Negative for cough, chest tightness, shortness of breath, wheezing and stridor.   Cardiovascular:  Negative for chest pain, palpitations and leg swelling.  Gastrointestinal:  Negative for abdominal distention, abdominal pain, anal bleeding, blood in stool, constipation, diarrhea,  nausea and vomiting.  Genitourinary:  Negative for difficulty urinating, dysuria, flank pain and hematuria.  Musculoskeletal:  Negative for arthralgias, back pain, gait problem, joint swelling and myalgias.  Skin:  Negative for color change, pallor, rash and wound.  Neurological:  Negative for dizziness, tremors, weakness and light-headedness.  Hematological:  Negative for adenopathy. Does not bruise/bleed easily.  Psychiatric/Behavioral:  Negative for agitation, behavioral problems, confusion, decreased concentration, dysphoric mood and sleep disturbance.        Objective:   Physical Exam Constitutional:      Appearance: He is well-developed.  HENT:     Head: Normocephalic and atraumatic.  Eyes:     Conjunctiva/sclera: Conjunctivae normal.  Cardiovascular:     Rate and Rhythm: Normal rate and regular rhythm.  Pulmonary:     Effort: Pulmonary effort is normal. No respiratory distress.     Breath sounds: No wheezing.  Abdominal:     General: There is no distension.     Palpations: Abdomen is soft.  Musculoskeletal:        General: No tenderness. Normal range of motion.     Cervical back: Normal range of motion and neck supple.  Skin:    General: Skin is warm and dry.     Coloration: Skin is not pale.     Findings: No erythema or rash.  Neurological:     General: No focal deficit present.     Mental Status: He is alert and oriented to person, place, and time.  Psychiatric:        Mood and Affect: Mood normal.        Behavior: Behavior normal.        Thought Content: Thought content normal.        Judgment: Judgment normal.           Assessment & Plan:   Assessment and Plan    HIV infection On Biktarvy with undetectable viral load and CD4 count at 522. Labs drawn today including HIV RNA, CD4 - Review lab results when available. - Schedule follow-up in six months.  Obesity Currently on low-dose semaglutide with increased appetite. Plan to increase dose in two  weeks. - Increase semaglutide dose in two weeks.  Hyperlipidemia On atorvastatin for cholesterol management.

## 2024-02-27 LAB — CBC WITH DIFFERENTIAL/PLATELET
Absolute Lymphocytes: 2703 {cells}/uL (ref 850–3900)
Absolute Monocytes: 446 {cells}/uL (ref 200–950)
Basophils Absolute: 68 {cells}/uL (ref 0–200)
Basophils Relative: 1.1 %
Eosinophils Absolute: 217 {cells}/uL (ref 15–500)
Eosinophils Relative: 3.5 %
HCT: 45.2 % (ref 38.5–50.0)
Hemoglobin: 15 g/dL (ref 13.2–17.1)
MCH: 29.5 pg (ref 27.0–33.0)
MCHC: 33.2 g/dL (ref 32.0–36.0)
MCV: 89 fL (ref 80.0–100.0)
MPV: 9.9 fL (ref 7.5–12.5)
Monocytes Relative: 7.2 %
Neutro Abs: 2765 {cells}/uL (ref 1500–7800)
Neutrophils Relative %: 44.6 %
Platelets: 314 10*3/uL (ref 140–400)
RBC: 5.08 10*6/uL (ref 4.20–5.80)
RDW: 12.9 % (ref 11.0–15.0)
Total Lymphocyte: 43.6 %
WBC: 6.2 10*3/uL (ref 3.8–10.8)

## 2024-02-27 LAB — COMPLETE METABOLIC PANEL WITH GFR
AG Ratio: 1.8 (calc) (ref 1.0–2.5)
ALT: 24 U/L (ref 9–46)
AST: 22 U/L (ref 10–40)
Albumin: 4.4 g/dL (ref 3.6–5.1)
Alkaline phosphatase (APISO): 49 U/L (ref 36–130)
BUN/Creatinine Ratio: 14 (calc) (ref 6–22)
BUN: 22 mg/dL (ref 7–25)
CO2: 24 mmol/L (ref 20–32)
Calcium: 9.3 mg/dL (ref 8.6–10.3)
Chloride: 104 mmol/L (ref 98–110)
Creat: 1.54 mg/dL — ABNORMAL HIGH (ref 0.60–1.26)
Globulin: 2.5 g/dL (ref 1.9–3.7)
Glucose, Bld: 101 mg/dL — ABNORMAL HIGH (ref 65–99)
Potassium: 4 mmol/L (ref 3.5–5.3)
Sodium: 136 mmol/L (ref 135–146)
Total Bilirubin: 0.9 mg/dL (ref 0.2–1.2)
Total Protein: 6.9 g/dL (ref 6.1–8.1)

## 2024-02-27 LAB — HIV-1 RNA QUANT-NO REFLEX-BLD
HIV 1 RNA Quant: 41 {copies}/mL — ABNORMAL HIGH
HIV-1 RNA Quant, Log: 1.61 {Log_copies}/mL — ABNORMAL HIGH

## 2024-02-27 LAB — LIPID PANEL
Cholesterol: 244 mg/dL — ABNORMAL HIGH (ref <200)
HDL: 43 mg/dL (ref >=40)
LDL Cholesterol (Calc): 179 mg/dL — ABNORMAL HIGH
Non-HDL Cholesterol (Calc): 201 mg/dL — ABNORMAL HIGH (ref <130)
Total CHOL/HDL Ratio: 5.7 (calc) — ABNORMAL HIGH (ref <5.0)
Triglycerides: 98 mg/dL (ref <150)

## 2024-02-27 LAB — URINE CYTOLOGY ANCILLARY ONLY
Chlamydia: NEGATIVE
Comment: NEGATIVE
Comment: NORMAL
Neisseria Gonorrhea: NEGATIVE

## 2024-02-27 LAB — T-HELPER CELLS (CD4) COUNT (NOT AT ARMC)
CD4 % Helper T Cell: 26 % — ABNORMAL LOW (ref 33–65)
CD4 T Cell Abs: 627 /uL (ref 400–1790)

## 2024-02-27 LAB — RPR: RPR Ser Ql: NONREACTIVE

## 2024-03-01 ENCOUNTER — Telehealth: Payer: Self-pay

## 2024-03-01 NOTE — Telephone Encounter (Signed)
 Patient aware. Patient stated that he needed to get cholesterol medication refilled but he is taking it.

## 2024-03-01 NOTE — Telephone Encounter (Signed)
 Patient called requesting lab results

## 2024-03-02 NOTE — Telephone Encounter (Signed)
 Left vm informing patient to bring medications to next appointment.

## 2024-08-18 ENCOUNTER — Telehealth: Payer: Self-pay

## 2024-08-18 NOTE — Telephone Encounter (Signed)
 Patient came in wanting to start getting shot's instead of oral medication, please call and advise if this is possible and if he will need to come in for labs. Best contact number is [phone]

## 2024-08-19 ENCOUNTER — Other Ambulatory Visit: Payer: Self-pay

## 2024-08-19 DIAGNOSIS — Z113 Encounter for screening for infections with a predominantly sexual mode of transmission: Secondary | ICD-10-CM

## 2024-08-19 DIAGNOSIS — B20 Human immunodeficiency virus [HIV] disease: Secondary | ICD-10-CM

## 2024-08-20 LAB — T-HELPER CELL (CD4) - (RCID CLINIC ONLY)
CD4 % Helper T Cell: 25 % — ABNORMAL LOW (ref 33–65)
CD4 T Cell Abs: 683 /uL (ref 400–1790)

## 2024-08-21 LAB — COMPLETE METABOLIC PANEL WITHOUT GFR
AG Ratio: 1.5 (calc) (ref 1.0–2.5)
ALT: 20 U/L (ref 9–46)
AST: 21 U/L (ref 10–40)
Albumin: 4.5 g/dL (ref 3.6–5.1)
Alkaline phosphatase (APISO): 51 U/L (ref 36–130)
BUN/Creatinine Ratio: 10 (calc) (ref 6–22)
BUN: 17 mg/dL (ref 7–25)
CO2: 25 mmol/L (ref 20–32)
Calcium: 9.5 mg/dL (ref 8.6–10.3)
Chloride: 104 mmol/L (ref 98–110)
Creat: 1.7 mg/dL — ABNORMAL HIGH (ref 0.60–1.26)
Globulin: 3 g/dL (ref 1.9–3.7)
Glucose, Bld: 82 mg/dL (ref 65–99)
Potassium: 4.2 mmol/L (ref 3.5–5.3)
Sodium: 137 mmol/L (ref 135–146)
Total Bilirubin: 0.8 mg/dL (ref 0.2–1.2)
Total Protein: 7.5 g/dL (ref 6.1–8.1)

## 2024-08-21 LAB — CBC WITH DIFFERENTIAL/PLATELET
Absolute Lymphocytes: 3069 {cells}/uL (ref 850–3900)
Absolute Monocytes: 420 {cells}/uL (ref 200–950)
Basophils Absolute: 73 {cells}/uL (ref 0–200)
Basophils Relative: 1.3 %
Eosinophils Absolute: 252 {cells}/uL (ref 15–500)
Eosinophils Relative: 4.5 %
HCT: 46.6 % (ref 38.5–50.0)
Hemoglobin: 15.6 g/dL (ref 13.2–17.1)
MCH: 29.6 pg (ref 27.0–33.0)
MCHC: 33.5 g/dL (ref 32.0–36.0)
MCV: 88.4 fL (ref 80.0–100.0)
MPV: 9.5 fL (ref 7.5–12.5)
Monocytes Relative: 7.5 %
Neutro Abs: 1786 {cells}/uL (ref 1500–7800)
Neutrophils Relative %: 31.9 %
Platelets: 318 Thousand/uL (ref 140–400)
RBC: 5.27 Million/uL (ref 4.20–5.80)
RDW: 13.1 % (ref 11.0–15.0)
Total Lymphocyte: 54.8 %
WBC: 5.6 Thousand/uL (ref 3.8–10.8)

## 2024-08-21 LAB — HIV-1 RNA QUANT-NO REFLEX-BLD
HIV 1 RNA Quant: <20 {copies}/mL — AB
HIV-1 RNA Quant, Log: <1.3 {Log_copies}/mL — AB

## 2024-08-21 LAB — RPR: RPR Ser Ql: NONREACTIVE

## 2024-08-23 ENCOUNTER — Telehealth: Payer: Self-pay

## 2024-08-23 NOTE — Telephone Encounter (Signed)
 Patient called requesting to review lab results. Relayed that CD4 healthy at over 600 and viral load undetectable. RPR negative. His creatinine is elevated from previous values, encouraged follow up with his PCP. Labs routed to [facility], [place].  Rakeb Kibble, BSN, RN

## 2024-08-31 NOTE — Progress Notes (Unsigned)
   Subjective:  Chief complaint: follow-up for HIV disease on medications   Patient ID: Luis Vincent, male    DOB: 1986/03/18, 38 y.o.   MRN: [id number]  HPI  Past Medical History:  Diagnosis Date   Anal fissure 07/28/2023   CKD (chronic kidney disease) 08/07/2022   Headache    Hemorrhoid 02/27/2022   HIV infection (HCC)    HTN (hypertension) 08/07/2022   Immune deficiency disorder    Obesity 08/01/2021   Routine screening for STI (sexually transmitted infection) 08/07/2022   Sleepiness 08/01/2021   Syphilis    Vaccine counseling 08/07/2022    No past surgical history on file.  Family History  Problem Relation Age of Onset   Lung cancer Father    Emphysema Father    Hypertension Sister    Hypertension Maternal Grandmother       Social History   Socioeconomic History   Marital status: Single    Spouse name: Not on file   Number of children: Not on file   Years of education: Not on file   Highest education level: Not on file  Occupational History   Not on file  Tobacco Use   Smoking status: Never   Smokeless tobacco: Never  Substance and Sexual Activity   Alcohol use: No   Drug use: No    Comment: declined condoms   Sexual activity: Not Currently    Comment: declined condoms  Other Topics Concern   Not on file  Social History Narrative   Works as a Naval architect.    Social Drivers of Corporate investment banker Strain: Not on file  Food Insecurity: Not on file  Transportation Needs: Not on file  Physical Activity: Not on file  Stress: Not on file  Social Connections: Not on file    No Known Allergies   Current Outpatient Medications:    atorvastatin  (LIPITOR) 20 MG tablet, Take 1 tablet (20 mg total) by mouth daily., Disp: 90 tablet, Rfl: 1   bictegravir-emtricitabine-tenofovir AF (BIKTARVY ) 50-200-25 MG TABS tablet, Take 1 tablet by mouth daily., Disp: 30 tablet, Rfl: 11   lisinopril  (PRINIVIL ) 10 MG tablet, Take 1 tablet (10 mg total) by  mouth daily. (Patient not taking: Reported on 02/25/2024), Disp: 90 tablet, Rfl: 1   phentermine 30 MG capsule, Take 30 mg by mouth every morning. (Patient not taking: Reported on 02/25/2024), Disp: , Rfl:    Semaglutide-Weight Management 0.25 MG/0.5ML SOAJ, Inject 0.25 mg into the skin., Disp: , Rfl:    Vitamin D , Ergocalciferol , (DRISDOL ) 1.25 MG (50000 UNIT) CAPS capsule, Take 1 capsule (50,000 Units total) by mouth every 7 (seven) days. (Patient not taking: Reported on 02/25/2024), Disp: 8 capsule, Rfl: 0   Review of Systems     Objective:   Physical Exam        Assessment & Plan:

## 2024-09-01 ENCOUNTER — Encounter: Payer: Self-pay | Admitting: Infectious Disease

## 2024-09-01 ENCOUNTER — Ambulatory Visit: Payer: Self-pay

## 2024-09-01 ENCOUNTER — Other Ambulatory Visit: Payer: Self-pay

## 2024-09-01 ENCOUNTER — Ambulatory Visit (INDEPENDENT_AMBULATORY_CARE_PROVIDER_SITE_OTHER): Payer: Self-pay | Admitting: Infectious Disease

## 2024-09-01 VITALS — BP 137/83 | HR 66 | Ht 71.0 in | Wt 273.0 lb

## 2024-09-01 DIAGNOSIS — E785 Hyperlipidemia, unspecified: Secondary | ICD-10-CM

## 2024-09-01 DIAGNOSIS — I129 Hypertensive chronic kidney disease with stage 1 through stage 4 chronic kidney disease, or unspecified chronic kidney disease: Secondary | ICD-10-CM

## 2024-09-01 DIAGNOSIS — E782 Mixed hyperlipidemia: Secondary | ICD-10-CM

## 2024-09-01 DIAGNOSIS — Z6841 Body Mass Index (BMI) 40.0 and over, adult: Secondary | ICD-10-CM

## 2024-09-01 DIAGNOSIS — B20 Human immunodeficiency virus [HIV] disease: Secondary | ICD-10-CM

## 2024-09-01 DIAGNOSIS — N183 Chronic kidney disease, stage 3 unspecified: Secondary | ICD-10-CM

## 2024-09-01 DIAGNOSIS — N1832 Chronic kidney disease, stage 3b: Secondary | ICD-10-CM

## 2024-09-01 DIAGNOSIS — E66813 Obesity, class 3: Secondary | ICD-10-CM

## 2024-09-01 DIAGNOSIS — Z7185 Encounter for immunization safety counseling: Secondary | ICD-10-CM

## 2024-09-01 DIAGNOSIS — R7303 Prediabetes: Secondary | ICD-10-CM

## 2024-09-01 DIAGNOSIS — G4733 Obstructive sleep apnea (adult) (pediatric): Secondary | ICD-10-CM

## 2024-09-01 DIAGNOSIS — I10 Essential (primary) hypertension: Secondary | ICD-10-CM

## 2024-09-01 MED ORDER — BIKTARVY 50-200-25 MG PO TABS: 1.0000 | ORAL_TABLET | Freq: Every day | ORAL | 11 refills | Status: DC

## 2024-09-06 ENCOUNTER — Ambulatory Visit: Payer: Self-pay | Admitting: Infectious Disease

## 2024-09-06 ENCOUNTER — Ambulatory Visit: Payer: Self-pay

## 2024-12-13 ENCOUNTER — Other Ambulatory Visit (HOSPITAL_COMMUNITY): Payer: Self-pay

## 2024-12-15 ENCOUNTER — Other Ambulatory Visit: Payer: Self-pay | Admitting: Pharmacist

## 2024-12-15 MED ORDER — BICTEGRAVIR-EMTRICITAB-TENOFOV 50-200-25 MG PO TABS: 1.0000 | ORAL_TABLET | Freq: Every day | ORAL | Status: AC

## 2024-12-15 NOTE — Progress Notes (Signed)
 Medication Samples have been provided to the patient.  Drug name: Biktarvy         Strength: 50/200/25 mg       Qty: 14 tablets (2 bottles) LOT: CVDSXA   Exp.Date: 1/28  Samples requested by Charlott Flowers, PharmD.  Dosing instructions: Take one tablet by mouth once daily  The patient has been instructed regarding the correct time, dose, and frequency of taking this medication, including desired effects and most common side effects.   Alan Geralds, PharmD, CPP, BCIDP, AAHIVP Clinical Pharmacist Practitioner Infectious Diseases Clinical Pharmacist [facility] for Infect[facility]

## 2024-12-27 ENCOUNTER — Other Ambulatory Visit (HOSPITAL_COMMUNITY): Payer: Self-pay

## 2024-12-27 ENCOUNTER — Other Ambulatory Visit: Payer: Self-pay

## 2024-12-27 ENCOUNTER — Ambulatory Visit: Payer: Self-pay

## 2024-12-30 ENCOUNTER — Other Ambulatory Visit: Payer: Self-pay | Admitting: Pharmacist

## 2024-12-30 ENCOUNTER — Other Ambulatory Visit (HOSPITAL_COMMUNITY): Payer: Self-pay

## 2024-12-30 MED ORDER — BICTEGRAVIR-EMTRICITAB-TENOFOV 50-200-25 MG PO TABS: 1.0000 | ORAL_TABLET | Freq: Every day | ORAL | Status: AC

## 2024-12-30 NOTE — Progress Notes (Signed)
 Medication Samples have been provided to the patient.  Drug name: Biktarvy         Strength: 50/200/25 mg       Qty: 7 tablets (1 bottles) LOT: CVDSXA   Exp.Date: 1/28  Samples requested by Duwaine Lowe, RN.  Dosing instructions: Take one tablet by mouth once daily  The patient has been instructed regarding the correct time, dose, and frequency of taking this medication, including desired effects and most common side effects.   Alan Geralds, PharmD, CPP, BCIDP, AAHIVP Clinical Pharmacist Practitioner Infectious Diseases Clinical Pharmacist [facility] for Infectious Disease

## 2024-12-31 ENCOUNTER — Other Ambulatory Visit (HOSPITAL_COMMUNITY): Payer: Self-pay

## 2024-12-31 ENCOUNTER — Telehealth: Payer: Self-pay

## 2024-12-31 NOTE — Telephone Encounter (Signed)
 [facility] Patient Advocate Encounter   Was successful in obtaining a [employer] copay card for BIKTARVY .  This copay card will make the patients copay $0.  I have spoken with the patient.    The billing information is as follows and has been shared with [facility].  RxBin: [id number] PCN: [id number] Member ID: [id number] Group ID: [id number]    Charmaine Sharps, CPhT Specialty Pharmacy Patient [facility] for Infectious [facility] Phone: [phone] Fax:  [phone]

## 2024-12-31 NOTE — Telephone Encounter (Signed)
 [facility] Patient Advocate Encounter  Insurance verification completed.    The patient is insured through [facility].     Ran test claim for CABENUVA The current 30 day co-pay is $0. [employer] covered [phone]  Ran test claim for DOVATO The current 30 day co-pay is $0.[employer] covered $780.75  Ran test claim for [facility] The current 30 day co-pay is $0.[employer] covered $[phone]  Ran test claim for BIKTARVY  The current 30 day co-pay is $3562.65.   PATIENT MAY NEED TO CHANGE MEDICATION DUE TO HIGH COPAY FOR BIKTARVY  BUT ALSO HIGH DEDUCTIBLE FROM INSURANCE PLEASE ADVISE. PATIENT WILL BE ABLE TO GET A COPAY CARD TO HELP HIM WITH BIKTARVY  THIS MONTH (JANUARY) BUT WILL ULTIMATELY FALL INTO THE SAME STATUS OF NOT BEING ABLE TO GET MEDS BY MARCH IF HE CONTINUES ON WITH BIKTARVY . ALL OPTIONS ABOVE BIKTARVY  ARE COVERED AND WOULD BE COVERED THROUGHOUT THE YEAR.   We will continue to follow to see if copay assistance is needed.  This test claim was processed through [facility]- copay amounts may vary at other pharmacies due to pharmacy/plan contracts, or as the patient moves through the different stages of their insurance plan.

## 2024-12-31 NOTE — Telephone Encounter (Signed)
 Please see note below regarding patient's medication coverage. Looks like the patient's medication will need to be changed due to insurance

## 2024-12-31 NOTE — Telephone Encounter (Signed)
 All the genotypes I reviewed were clear for any NRTI resistance!

## 2025-01-03 ENCOUNTER — Other Ambulatory Visit: Payer: Self-pay | Admitting: Pharmacist

## 2025-01-03 MED ORDER — DOVATO 50-300 MG PO TABS: 1.0000 | ORAL_TABLET | Freq: Every day | ORAL | 2 refills | Status: AC

## 2025-01-03 NOTE — Telephone Encounter (Signed)
 Sent!

## 2025-03-02 ENCOUNTER — Ambulatory Visit: Payer: Self-pay | Admitting: Infectious Disease
